# Patient Record
Sex: Male | Born: 1935 | Smoking: Current every day smoker
Health system: Southern US, Community
[De-identification: ages and names within clinical notes are randomized; demographics above are authoritative.]

## PROBLEM LIST (undated history)

## (undated) DIAGNOSIS — I2510 Atherosclerotic heart disease of native coronary artery without angina pectoris: Secondary | ICD-10-CM

## (undated) DIAGNOSIS — F419 Anxiety disorder, unspecified: Secondary | ICD-10-CM

## (undated) DIAGNOSIS — R569 Unspecified convulsions: Secondary | ICD-10-CM

## (undated) DIAGNOSIS — I739 Peripheral vascular disease, unspecified: Secondary | ICD-10-CM

## (undated) DIAGNOSIS — I219 Acute myocardial infarction, unspecified: Secondary | ICD-10-CM

## (undated) DIAGNOSIS — M109 Gout, unspecified: Secondary | ICD-10-CM

## (undated) DIAGNOSIS — F341 Dysthymic disorder: Secondary | ICD-10-CM

## (undated) DIAGNOSIS — I639 Cerebral infarction, unspecified: Secondary | ICD-10-CM

## (undated) DIAGNOSIS — I10 Essential (primary) hypertension: Secondary | ICD-10-CM

## (undated) DIAGNOSIS — J42 Unspecified chronic bronchitis: Secondary | ICD-10-CM

## (undated) DIAGNOSIS — I499 Cardiac arrhythmia, unspecified: Secondary | ICD-10-CM

## (undated) HISTORY — DX: Cardiac arrhythmia, unspecified: I49.9

## (undated) HISTORY — DX: Cerebral infarction, unspecified: I63.9

## (undated) HISTORY — DX: Anxiety disorder, unspecified: F41.9

## (undated) HISTORY — PX: APPENDECTOMY: SHX54

## (undated) HISTORY — DX: Essential (primary) hypertension: I10

## (undated) HISTORY — PX: HERNIA REPAIR: SHX51

## (undated) HISTORY — DX: Atherosclerotic heart disease of native coronary artery without angina pectoris: I25.10

## (undated) HISTORY — DX: Peripheral vascular disease, unspecified: I73.9

## (undated) HISTORY — DX: Dysthymic disorder: F34.1

## (undated) HISTORY — PX: CHOLECYSTECTOMY: SHX55

## (undated) HISTORY — PX: SKIN CANCER EXCISION: SHX779

## (undated) HISTORY — DX: Unspecified convulsions: R56.9

## (undated) HISTORY — DX: Acute myocardial infarction, unspecified: I21.9

## (undated) HISTORY — PX: CARDIAC CATHETERIZATION: SHX172

## (undated) HISTORY — DX: Unspecified chronic bronchitis: J42

## (undated) HISTORY — DX: Gout, unspecified: M10.9

## (undated) HISTORY — PX: OTHER SURGICAL HISTORY: SHX169

---

## 2010-12-31 ENCOUNTER — Ambulatory Visit: Payer: Medicare Other | Admitting: Family Medicine

## 2011-01-19 ENCOUNTER — Emergency Department: Payer: Medicare Other | Admitting: Emergency Medicine

## 2011-01-19 ENCOUNTER — Encounter: Payer: Self-pay | Admitting: Cardiology

## 2011-01-29 ENCOUNTER — Encounter: Payer: Self-pay | Admitting: Cardiology

## 2011-01-30 ENCOUNTER — Encounter: Payer: Self-pay | Admitting: *Deleted

## 2011-01-30 ENCOUNTER — Encounter: Payer: Self-pay | Admitting: Cardiology

## 2011-01-30 ENCOUNTER — Ambulatory Visit (INDEPENDENT_AMBULATORY_CARE_PROVIDER_SITE_OTHER): Payer: Medicare Other | Admitting: Cardiology

## 2011-01-30 DIAGNOSIS — R42 Dizziness and giddiness: Secondary | ICD-10-CM

## 2011-01-30 DIAGNOSIS — I712 Thoracic aortic aneurysm, without rupture, unspecified: Secondary | ICD-10-CM

## 2011-01-30 DIAGNOSIS — I447 Left bundle-branch block, unspecified: Secondary | ICD-10-CM

## 2011-01-30 DIAGNOSIS — R0609 Other forms of dyspnea: Secondary | ICD-10-CM

## 2011-01-30 DIAGNOSIS — R0602 Shortness of breath: Secondary | ICD-10-CM

## 2011-01-30 DIAGNOSIS — I2510 Atherosclerotic heart disease of native coronary artery without angina pectoris: Secondary | ICD-10-CM

## 2011-01-30 DIAGNOSIS — I10 Essential (primary) hypertension: Secondary | ICD-10-CM | POA: Insufficient documentation

## 2011-01-30 DIAGNOSIS — E785 Hyperlipidemia, unspecified: Secondary | ICD-10-CM

## 2011-01-30 DIAGNOSIS — R0600 Dyspnea, unspecified: Secondary | ICD-10-CM

## 2011-01-30 DIAGNOSIS — I77810 Thoracic aortic ectasia: Secondary | ICD-10-CM

## 2011-01-30 NOTE — Assessment & Plan Note (Signed)
Patient has LBBB of uncertain duration.  Could represent conduction system disease, but could also be a sign of CAD.  Will get [employer] (as above).

## 2011-01-30 NOTE — Patient Instructions (Signed)
Stop taking Sudafed, change to Coricidin.  Your physician has requested that you have an echocardiogram. Echocardiography is a painless test that uses sound waves to create images of your heart. It provides your doctor with information about the size and shape of your heart and how well your heart's chambers and valves are working. This procedure takes approximately one hour. There are no restrictions for this procedure.  Your physician has requested that you have a lexiscan myoview. For further information please visit [email or web address] Please follow instruction sheet, as given.  Please get a Chest X-Ray today.  We will call you with lab results drawn today.  Your physician recommends that you schedule a follow-up appointment in: 2 weeks

## 2011-01-30 NOTE — Progress Notes (Signed)
PCP: Dr. Jake Shark   75 yo with history of smoking, CVA, and diabetes presents for evaluation of dyspnea.  He was referred by the ER.  For about 1 month, he has been "sick."  He has had a chronic cough and periodic fevers.  He has been short of breath just walking around his house over that period.  He wheezes at night.  Prior to this month, he denies significant dyspnea.  No orthopnea or PND.  He has had no chest pain.  He gets periodic dizziness.  This seems to be a vertigo-like spinning sensation that will last for a few seconds then resolve.  It does not seem to be positional.  No claudication-type leg pain.   He went to the ER on 12/16 with fever to 101, dizziness and shortness of breath.  CXR at the time showed no PNA.  There was cardiomegaly with a vague LUL nodular density thought to most likely represent an osteophyte.  ECG showed LBBB in the ER.  Cardiac enzymes and influenza test were negative.  He was treated for acute bronchitis with a course of levofloxacin.  Cough persisted, and he has been started recently on a course of azithromycin.  He is taking pseudoephedrine and BP is running high.   CXR was done today, showing no PNA or edema.  There was, however, a dilated thoracic aorta up to 6.5 cm in greatest diameter . Again, the patient has had no chest pain.  I had the radiologist pull up the prior CXR from 12/16.  Though it was not reported, the aorta was similarly dilated at that time as well.   ECG: NSR, LBBB, occasional PACs  Labs (12/12): cardiac enzymes negative, K 4.2, creatinine 1.9, HCT 42.5  PMH: 1. CVA: On Plavix. 2. LBBB: ? Chronic or new 3. CKD 4. Active smoker, probable COPD 5. Gout 6. Hyperlipidemia 7. Diabetes mellitus type II 8. Depression 9. H/o cholecystectomy 10.  Head injury requiring surgery in 1960s, sounds like traumatic subdural hematoma.  11.  HTN  SH: Single, lives in [place] with daughter, moved here in 10/12 from [place].  Smokes 1 ppd.  Used  to drink heavily but quit around 6/12.   FH: Mother with CABG in her 70s.  Father with cirrhosis, CAD.   ROS: All systems reviewed and negative except as per HPI.   Current Outpatient Prescriptions  Medication Sig Dispense Refill  . allopurinol (ZYLOPRIM) 300 MG tablet Take 300 mg by mouth daily.        Marland Kitchen amitriptyline (ELAVIL) 25 MG tablet Take 25 mg by mouth at bedtime.        Marland Kitchen atorvastatin (LIPITOR) 20 MG tablet Take 20 mg by mouth daily.        Marland Kitchen azithromycin (ZITHROMAX) 250 MG tablet Take 250 mg by mouth daily.        . cetirizine-pseudoephedrine (ZYRTEC-D) 5-120 MG per tablet Take 1 tablet by mouth 1 day or 1 dose.        . cloNIDine (CATAPRES) 0.1 MG tablet Take one-half tablet by mouth twice a day.       . clopidogrel (PLAVIX) 75 MG tablet Take 75 mg by mouth daily.        Marland Kitchen glipiZIDE (GLUCOTROL XL) 2.5 MG 24 hr tablet Take 2.5 mg by mouth daily.        . Naproxen Sodium (ALEVE) 220 MG CAPS Take by mouth.        . sertraline (ZOLOFT) 100 MG tablet Take  100 mg by mouth daily.          BP 152/72  Pulse 96  Ht 5\' 7"  (1.702 m)  Wt 86.41 kg (190 lb 8 oz)  BMI 29.84 kg/m2 General: NAD Neck: No JVD, no thyromegaly or thyroid nodule.  Lungs: Clear to auscultation bilaterally with normal respiratory effort, mildly prolonged expiratory phase.  No wheezes or crackles. CV: Nondisplaced PMI.  Heart regular S1/S2, no S3/S4, no murmur.  1+ bilateral ankle edema.  No carotid bruit.  1+ PT pulse on the left, unable to palpate PT pulse on the right.  Abdomen: Soft, nontender, no hepatosplenomegaly, no distention.  Skin: Intact without lesions or rashes.  Neurologic: Alert and oriented x 3.  Psych: Normal affect. Extremities: No clubbing or cyanosis.  HEENT: Normal.

## 2011-01-30 NOTE — Assessment & Plan Note (Signed)
Patient has had dypsnea with exertion, chronic cough, and occasional fever over the last month.  On exam today, he does not appear volume overloaded.  CXR showed no PNA or edema.  Given his smoking history, this certainly could represent acute bronchitis with COPD flare and prolonged cough.  His lungs are clear and he is not wheezing, so I do not think that addition of steroids is warranted at this time.   - Complete azithromycin course . - Will check echocardiogram for LV and RV function and will also get a BNP.  - Exertional dyspnea could certainly be an anginal equivalent.  He has a LBBB of uncertain age.  I will also arrange for a Lexiscan myoview.

## 2011-01-30 NOTE — Assessment & Plan Note (Signed)
Tortuous thoracic aorta, appears to be dilated to 6.5 cm on the CXR today.  I had the radiologist review the study from 12/16.  Though not mentioned in the report, the same finding was present then.  The patient denies any chest pain, so less likely acute dissection.  I think this needs to be imaged more closely.  I will check a BMET today.  If creatinine is </= 1.5, will get CT angiogram of the chest.  If > 1.5, will get CT chest without contrast.

## 2011-01-30 NOTE — Assessment & Plan Note (Signed)
He is on clonidine only.  Not ideal regimen, will change eventually when he is over the acute illness.  I asked him to stop taking pseudoephedrine and to use Coricidin instead for congestion.

## 2011-01-30 NOTE — Progress Notes (Signed)
Addended by: Lanny Hurst E on: 01/30/2011 05:02 PM   Modules accepted: Orders

## 2011-01-30 NOTE — Assessment & Plan Note (Signed)
Known vascular disease with history of CVA.  Goal LDL < 70.  Will check lipids/LFTs today.

## 2011-01-31 ENCOUNTER — Encounter: Payer: Self-pay | Admitting: Cardiology

## 2011-01-31 ENCOUNTER — Encounter: Payer: Self-pay | Admitting: Cardiovascular Disease

## 2011-01-31 ENCOUNTER — Telehealth: Payer: Self-pay | Admitting: *Deleted

## 2011-01-31 ENCOUNTER — Other Ambulatory Visit: Payer: Self-pay | Admitting: Cardiology

## 2011-01-31 DIAGNOSIS — I712 Thoracic aortic aneurysm, without rupture: Secondary | ICD-10-CM

## 2011-01-31 DIAGNOSIS — R7989 Other specified abnormal findings of blood chemistry: Secondary | ICD-10-CM

## 2011-01-31 LAB — LIPID PANEL
Chol/HDL Ratio: 3.8 ratio units (ref 0.0–5.0)
Cholesterol, Total: 114 mg/dL (ref 100–199)
LDL Calculated: 49 mg/dL (ref 0–99)
VLDL Cholesterol Cal: 35 mg/dL (ref 5–40)

## 2011-01-31 LAB — BASIC METABOLIC PANEL
BUN/Creatinine Ratio: 12 (ref 10–22)
BUN: 21 mg/dL (ref 8–27)
CO2: 22 mmol/L (ref 20–32)
Calcium: 8.5 mg/dL — ABNORMAL LOW (ref 8.6–10.2)
Chloride: 102 mmol/L (ref 97–108)

## 2011-01-31 LAB — HEPATIC FUNCTION PANEL
Albumin: 3.9 g/dL (ref 3.5–4.8)
Alkaline Phosphatase: 79 IU/L (ref 25–160)
Bilirubin, Direct: 0.1 mg/dL (ref 0.00–0.40)
Total Bilirubin: 0.2 mg/dL (ref 0.0–1.2)
Total Protein: 6.7 g/dL (ref 6.0–8.5)

## 2011-01-31 LAB — BRAIN NATRIURETIC PEPTIDE: BNP: 477.1 pg/mL — ABNORMAL HIGH (ref 0.0–100.0)

## 2011-01-31 NOTE — Telephone Encounter (Signed)
Pt scheduled for CT scan chest without contrast. Radiologist at [facility] advised we use contrast for reason of test; stated their cut off of creat is 1.8. Pt's creat is 1.8. Per Dr. Shirlee Latch, ok to change order to with contrast as long as pt pushes fluids all day. Pt does not take lasix, no h/o HF.  Pt and daughter aware to push fluids to protect kidneys. New order sent.

## 2011-01-31 NOTE — Telephone Encounter (Signed)
Notified pts daughter of results of CT-Angiogram, pt does have aortic aneurysm, at this point per Dr. Shirlee Latch, will need to keep close watch on his BP. No surgery/intervention needed at this time. He does not have dissection. Pt instructed to drink plenty of fluids today and throughout weekend. He will come in Monday for BMP. Pt also advised to monitor BP and bring numbers with him on Monday and will show Dr. Shirlee Latch for BP mgt.

## 2011-02-01 ENCOUNTER — Inpatient Hospital Stay: Payer: Medicare Other | Admitting: Internal Medicine

## 2011-02-03 ENCOUNTER — Encounter: Payer: Self-pay | Admitting: Cardiovascular Disease

## 2011-02-03 ENCOUNTER — Other Ambulatory Visit: Payer: Medicare Other | Admitting: *Deleted

## 2011-02-05 ENCOUNTER — Other Ambulatory Visit (HOSPITAL_COMMUNITY): Payer: Medicare Other | Admitting: Radiology

## 2011-02-11 ENCOUNTER — Ambulatory Visit: Payer: Medicare Other | Admitting: Cardiovascular Disease

## 2011-02-11 ENCOUNTER — Encounter: Payer: Self-pay | Admitting: Cardiovascular Disease

## 2011-02-11 ENCOUNTER — Ambulatory Visit (INDEPENDENT_AMBULATORY_CARE_PROVIDER_SITE_OTHER): Payer: Medicare Other | Admitting: Cardiovascular Disease

## 2011-02-11 VITALS — BP 140/85 | HR 75 | Resp 16 | Ht 67.0 in | Wt 187.0 lb

## 2011-02-11 DIAGNOSIS — R9431 Abnormal electrocardiogram [ECG] [EKG]: Secondary | ICD-10-CM

## 2011-02-11 DIAGNOSIS — I712 Thoracic aortic aneurysm, without rupture, unspecified: Secondary | ICD-10-CM

## 2011-02-11 DIAGNOSIS — R0600 Dyspnea, unspecified: Secondary | ICD-10-CM

## 2011-02-11 DIAGNOSIS — I714 Abdominal aortic aneurysm, without rupture, unspecified: Secondary | ICD-10-CM

## 2011-02-11 DIAGNOSIS — R0602 Shortness of breath: Secondary | ICD-10-CM

## 2011-02-11 DIAGNOSIS — R0989 Other specified symptoms and signs involving the circulatory and respiratory systems: Secondary | ICD-10-CM

## 2011-02-11 DIAGNOSIS — E785 Hyperlipidemia, unspecified: Secondary | ICD-10-CM

## 2011-02-11 DIAGNOSIS — I10 Essential (primary) hypertension: Secondary | ICD-10-CM

## 2011-02-11 DIAGNOSIS — R5383 Other fatigue: Secondary | ICD-10-CM | POA: Insufficient documentation

## 2011-02-11 DIAGNOSIS — R5381 Other malaise: Secondary | ICD-10-CM

## 2011-02-11 NOTE — Assessment & Plan Note (Addendum)
We have suggested he have a repeat abdominal aortic ultrasound in one year to evaluate his 4.8 cm AAA. Medical management for his 4 cm thoracic aneurysm.

## 2011-02-11 NOTE — Assessment & Plan Note (Signed)
Etiology of his fatigue is uncertain. He is high risk for underlying coronary artery disease. Has carotid disease on exam, known aortic disease and lower extremity disease. He has not had a stress test in many years. We have ordered a pharmacologic Myoview to rule out ischemia as a cause of his fatigue and EKG changes as well as shortness of breath.

## 2011-02-11 NOTE — Progress Notes (Signed)
Patient ID: Stephen Weeks, male    DOB: 22-Apr-1935, 76 y.o.   MRN: [id number]  HPI Comments: 76 yo with history of smoking (still smoking), COPD, CVA, and diabetes presents for routine follow up. Notes indicate a history of coronary artery disease and prior stenting though the details are unavailable. He does report stenting in his legs. He was seen on December 27 and shortly after that was admitted to the hospital with recurrent bronchitis. Earlier that month, he had a long hospital course for pneumonia. He was found to have 4 cm descending thoracic aneurysm, 4.8 cm infrarenal AAA, also with dehydration and renal failure, severe hypertension on arrival to the emergency room with shortness of breath.  Currently he reports feeling much better. He believes his bronchitis has resolved and his blood pressure is significantly better on his current medication regimen. He is concerned about the CT scan. He continues to smoke one pack per day. He has had worsening memory and does have chronic fatigue over the past several years.   ECG: Left bundle branch block with rate 58 beats per minute  February 02, 2011 total cholesterol 118, LDL 64, HDL chief Echocardiogram February 01, 2011 shows ejection fraction 50%, mitral valve regurgitation    Outpatient Encounter Prescriptions as of 02/11/2011  Medication Sig Dispense Refill  . allopurinol (ZYLOPRIM) 300 MG tablet Take 300 mg by mouth daily.        Marland Kitchen amitriptyline (ELAVIL) 25 MG tablet Take 25 mg by mouth at bedtime.        Marland Kitchen aspirin 81 MG tablet Take 81 mg by mouth daily.        Marland Kitchen atorvastatin (LIPITOR) 20 MG tablet Take 20 mg by mouth daily.        Marland Kitchen azithromycin (ZITHROMAX) 250 MG tablet Take 250 mg by mouth daily.        . cetirizine-pseudoephedrine (ZYRTEC-D) 5-120 MG per tablet Take 1 tablet by mouth 1 day or 1 dose.        . cloNIDine (CATAPRES) 0.1 MG tablet Take one-half tablet by mouth twice a day.       . clopidogrel (PLAVIX) 75 MG tablet  Take 75 mg by mouth daily.        Marland Kitchen glipiZIDE (GLUCOTROL XL) 2.5 MG 24 hr tablet Take 2.5 mg by mouth daily.        . metoprolol (LOPRESSOR) 50 MG tablet Take 50 mg by mouth 2 (two) times daily.        . Naproxen Sodium (ALEVE) 220 MG CAPS Take by mouth.        . sertraline (ZOLOFT) 100 MG tablet Take 100 mg by mouth daily.           Review of Systems  Constitutional: Positive for fatigue.  HENT: Negative.   Eyes: Negative.   Respiratory: Positive for shortness of breath.   Cardiovascular: Negative.   Gastrointestinal: Negative.   Musculoskeletal: Negative.   Skin: Negative.   Neurological: Negative.   Hematological: Negative.   Psychiatric/Behavioral: Negative.   All other systems reviewed and are negative.    BP 140/85  Pulse 75  Resp 16  Ht 5\' 7"  (1.702 m)  Wt 187 lb (84.823 kg)  BMI 29.29 kg/m2  Physical Exam  Nursing note and vitals reviewed. Constitutional: He is oriented to person, place, and time. He appears well-developed and well-nourished.  HENT:  Head: Normocephalic.  Nose: Nose normal.  Mouth/Throat: Oropharynx is clear and moist.  Eyes: Conjunctivae are normal. Pupils  are equal, round, and reactive to light.  Neck: Normal range of motion. Neck supple. No JVD present. Carotid bruit is present.  Cardiovascular: Normal rate, regular rhythm, S1 normal, S2 normal and intact distal pulses.  Exam reveals no gallop and no friction rub.   Murmur heard.  Crescendo systolic murmur is present with a grade of 1/6  Pulmonary/Chest: Effort normal. No respiratory distress. He has decreased breath sounds. He has no wheezes. He has no rales. He exhibits no tenderness.  Abdominal: Soft. Bowel sounds are normal. He exhibits no distension. There is no tenderness.  Musculoskeletal: Normal range of motion. He exhibits no edema and no tenderness.  Lymphadenopathy:    He has no cervical adenopathy.  Neurological: He is alert and oriented to person, place, and time. Coordination  normal.  Skin: Skin is warm and dry. No rash noted. No erythema.  Psychiatric: He has a normal mood and affect. His behavior is normal. Judgment and thought content normal.           Assessment and Plan

## 2011-02-11 NOTE — Assessment & Plan Note (Addendum)
I suspect his shortness of breath could be multifactorial though concerning for coronary artery disease, COPD as well as deconditioning. He is recovering from bronchitis. We'll schedule him for a stress test at [facility].

## 2011-02-11 NOTE — Patient Instructions (Addendum)
You are doing well. No medication changes were made.  We will call you to schedule a carotid artery ultasound repeat abdomenal ultrasound    We will call you to schedule a stress test for fatigue  Please call us if you have new issues that need to be addressed before your next appt.  Your physician wants you to follow-up in: 6 months.  You will receive a reminder letter in the mail two months in advance. If you don't receive a letter, please call our office to schedule the follow-up appointment.

## 2011-02-11 NOTE — Assessment & Plan Note (Signed)
Carotid bruit on the left. Carotid ultrasound will be ordered.

## 2011-02-11 NOTE — Assessment & Plan Note (Signed)
Cholesterol is at goal on the current lipid regimen. No changes to the medications were made.  

## 2011-02-11 NOTE — Assessment & Plan Note (Signed)
Blood pressures well controlled on his current medication regimen. No changes made

## 2011-02-12 ENCOUNTER — Telehealth: Payer: Self-pay | Admitting: *Deleted

## 2011-02-12 NOTE — Telephone Encounter (Signed)
Attempted to contact pt's daughter, Lupita Leash, [place] TCB. Pt has been r/s for lexiscan per TG. He will need to arrive at 0730 for 0800 start for procedure. Will give instructions for meds when she calls back.

## 2011-02-12 NOTE — Telephone Encounter (Signed)
Pt's daughter notified of instructions and date/time.

## 2011-02-17 ENCOUNTER — Telehealth: Payer: Self-pay | Admitting: *Deleted

## 2011-02-17 NOTE — Telephone Encounter (Signed)
Pt's son-in-law notified of the below. Also notified of change in time for lexiscan tomorrow, asked that he arrive at 7am at Registration at [facility].

## 2011-02-17 NOTE — Telephone Encounter (Signed)
Attempted to contact pt's daughter, LMOM TCB. Per Dr. Mariah Milling, pt's echo that was done at [facility] actually shows mitral regurgitatio moderate, instead of moderate to severe per report. Will notify of this result and cancel upcoming echo on 1/29 that was previously scheduled.

## 2011-02-18 ENCOUNTER — Encounter: Payer: Medicare Other | Admitting: *Deleted

## 2011-02-18 ENCOUNTER — Ambulatory Visit: Payer: Self-pay | Admitting: Cardiovascular Disease

## 2011-02-18 ENCOUNTER — Encounter: Payer: Self-pay | Admitting: Cardiovascular Disease

## 2011-02-18 DIAGNOSIS — R0602 Shortness of breath: Secondary | ICD-10-CM

## 2011-02-19 ENCOUNTER — Telehealth: Payer: Self-pay | Admitting: *Deleted

## 2011-02-19 NOTE — Telephone Encounter (Signed)
He will need a carotid ultrasound if he has not had one

## 2011-02-19 NOTE — Telephone Encounter (Signed)
Pt had myoview 02/18/11 at [facility], and he was also scheduled for carotid here at office. We cancelled the carotid, I believe was old order prior to his admission to [facility] recently. But wanted to find out if pt still needs carotid r/s? Please advised.

## 2011-02-20 NOTE — Telephone Encounter (Signed)
Called patient and made appt for the carotid on feb. 1st at 11:30am.

## 2011-02-21 ENCOUNTER — Other Ambulatory Visit: Payer: Medicare Other | Admitting: *Deleted

## 2011-02-21 NOTE — Progress Notes (Signed)
Forward to MD to review.

## 2011-02-25 ENCOUNTER — Encounter: Payer: Self-pay | Admitting: Cardiovascular Disease

## 2011-02-26 ENCOUNTER — Telehealth: Payer: Self-pay | Admitting: *Deleted

## 2011-02-26 NOTE — Telephone Encounter (Signed)
Notified pt's daughter, Lupita Leash, that per Dr. Mariah Milling, Eugenie Birks did show sign of old damage and cardiac squeeze was decr. Advised if he continues to have CP to be seen in clinic as TG may want to schedule cardiac cath, otherwise, if no s/s to be seen in 2-3 months. Daughter states he is in the middle of nose surgery right now and she will call back tomorrow to schedule appt.

## 2011-03-04 ENCOUNTER — Other Ambulatory Visit: Payer: Medicare Other | Admitting: *Deleted

## 2011-03-06 ENCOUNTER — Encounter: Payer: Self-pay | Admitting: Cardiovascular Disease

## 2011-03-07 ENCOUNTER — Encounter (INDEPENDENT_AMBULATORY_CARE_PROVIDER_SITE_OTHER): Payer: Medicare Other | Admitting: *Deleted

## 2011-03-07 DIAGNOSIS — R0989 Other specified symptoms and signs involving the circulatory and respiratory systems: Secondary | ICD-10-CM

## 2011-03-07 DIAGNOSIS — I6529 Occlusion and stenosis of unspecified carotid artery: Secondary | ICD-10-CM

## 2011-05-26 ENCOUNTER — Ambulatory Visit: Payer: Self-pay

## 2011-05-26 LAB — COMPREHENSIVE METABOLIC PANEL
Albumin: 3.6 g/dL (ref 3.4–5.0)
BUN: 22 mg/dL — ABNORMAL HIGH (ref 7–18)
Calcium, Total: 8.5 mg/dL (ref 8.5–10.1)
Chloride: 108 mmol/L — ABNORMAL HIGH (ref 98–107)
Co2: 25 mmol/L (ref 21–32)
Creatinine: 1.68 mg/dL — ABNORMAL HIGH (ref 0.60–1.30)
EGFR (African American): 46 — ABNORMAL LOW
Potassium: 4.7 mmol/L (ref 3.5–5.1)
Sodium: 142 mmol/L (ref 136–145)
Total Protein: 7.2 g/dL (ref 6.4–8.2)

## 2011-07-23 ENCOUNTER — Emergency Department: Payer: Self-pay

## 2011-07-23 LAB — CBC
HCT: 46.3 % (ref 40.0–52.0)
MCH: 30.4 pg (ref 26.0–34.0)
MCHC: 33.4 g/dL (ref 32.0–36.0)
MCV: 91 fL (ref 80–100)
Platelet: 159 10*3/uL (ref 150–440)
RDW: 15.5 % — ABNORMAL HIGH (ref 11.5–14.5)
WBC: 11.5 10*3/uL — ABNORMAL HIGH (ref 3.8–10.6)

## 2011-07-23 LAB — ETHANOL: Ethanol: <3 mg/dL

## 2011-07-23 LAB — BASIC METABOLIC PANEL
Anion Gap: 8 (ref 7–16)
BUN: 22 mg/dL — ABNORMAL HIGH (ref 7–18)
Calcium, Total: 8.9 mg/dL (ref 8.5–10.1)
Chloride: 104 mmol/L (ref 98–107)
EGFR (Non-African Amer.): 39 — ABNORMAL LOW
Osmolality: 282 (ref 275–301)
Sodium: 139 mmol/L (ref 136–145)

## 2011-07-23 LAB — TSH: Thyroid Stimulating Horm: 2.07 u[IU]/mL

## 2011-07-23 LAB — URINALYSIS, COMPLETE
Bilirubin,UR: NEGATIVE
Blood: NEGATIVE
Glucose,UR: NEGATIVE mg/dL (ref 0–75)
Hyaline Cast: 9
Leukocyte Esterase: NEGATIVE
Nitrite: NEGATIVE
Protein: 100
WBC UR: 2 /HPF (ref 0–5)

## 2011-07-23 LAB — DRUG SCREEN, URINE
Cannabinoid 50 Ng, Ur ~~LOC~~: NEGATIVE (ref ?–50)
MDMA (Ecstasy)Ur Screen: NEGATIVE (ref ?–500)
Opiate, Ur Screen: NEGATIVE (ref ?–300)
Phencyclidine (PCP) Ur S: NEGATIVE (ref ?–25)

## 2011-07-23 LAB — CK TOTAL AND CKMB (NOT AT ARMC): CK, Total: 76 U/L (ref 35–232)

## 2011-08-18 LAB — URINALYSIS, COMPLETE
Bacteria: NONE SEEN
Bilirubin,UR: NEGATIVE
Glucose,UR: NEGATIVE mg/dL (ref 0–75)
Ph: 5 (ref 4.5–8.0)
RBC,UR: 1 /HPF (ref 0–5)
Squamous Epithelial: NONE SEEN

## 2011-08-18 LAB — CBC
MCH: 31 pg (ref 26.0–34.0)
MCHC: 33.8 g/dL (ref 32.0–36.0)
Platelet: 140 10*3/uL — ABNORMAL LOW (ref 150–440)
RBC: 4.57 10*6/uL (ref 4.40–5.90)

## 2011-08-18 LAB — BASIC METABOLIC PANEL
BUN: 20 mg/dL — ABNORMAL HIGH (ref 7–18)
Calcium, Total: 8.4 mg/dL — ABNORMAL LOW (ref 8.5–10.1)
EGFR (Non-African Amer.): 41 — ABNORMAL LOW
Glucose: 131 mg/dL — ABNORMAL HIGH (ref 65–99)
Potassium: 4.1 mmol/L (ref 3.5–5.1)
Sodium: 142 mmol/L (ref 136–145)

## 2011-08-18 LAB — CK TOTAL AND CKMB (NOT AT ARMC)
CK, Total: 47 U/L (ref 35–232)
CK-MB: 1.2 ng/mL (ref 0.5–3.6)

## 2011-08-18 LAB — TROPONIN I: Troponin-I: 0.02 ng/mL

## 2011-08-19 LAB — CK TOTAL AND CKMB (NOT AT ARMC)
CK, Total: 36 U/L (ref 35–232)
CK-MB: 1 ng/mL (ref 0.5–3.6)
CK-MB: 1.1 ng/mL (ref 0.5–3.6)

## 2011-08-19 LAB — BASIC METABOLIC PANEL
Anion Gap: 8 (ref 7–16)
Chloride: 108 mmol/L — ABNORMAL HIGH (ref 98–107)
Co2: 27 mmol/L (ref 21–32)
Creatinine: 1.71 mg/dL — ABNORMAL HIGH (ref 0.60–1.30)
Sodium: 143 mmol/L (ref 136–145)

## 2011-08-19 LAB — URINE CULTURE

## 2011-08-20 ENCOUNTER — Inpatient Hospital Stay: Payer: Self-pay | Admitting: Internal Medicine

## 2011-08-20 LAB — BASIC METABOLIC PANEL
Anion Gap: 6 — ABNORMAL LOW (ref 7–16)
Calcium, Total: 8.5 mg/dL (ref 8.5–10.1)
Chloride: 109 mmol/L — ABNORMAL HIGH (ref 98–107)
EGFR (Non-African Amer.): 37 — ABNORMAL LOW
Glucose: 122 mg/dL — ABNORMAL HIGH (ref 65–99)
Potassium: 4.6 mmol/L (ref 3.5–5.1)
Sodium: 143 mmol/L (ref 136–145)

## 2011-08-21 LAB — BASIC METABOLIC PANEL
Anion Gap: 5 — ABNORMAL LOW (ref 7–16)
BUN: 24 mg/dL — ABNORMAL HIGH (ref 7–18)
Chloride: 107 mmol/L (ref 98–107)
Creatinine: 1.97 mg/dL — ABNORMAL HIGH (ref 0.60–1.30)
EGFR (African American): 38 — ABNORMAL LOW
EGFR (Non-African Amer.): 33 — ABNORMAL LOW
Osmolality: 287 (ref 275–301)

## 2011-08-21 LAB — CBC WITH DIFFERENTIAL/PLATELET
Basophil #: 0 10*3/uL (ref 0.0–0.1)
Lymphocyte %: 8.2 %
MCH: 29.9 pg (ref 26.0–34.0)
MCHC: 32.4 g/dL (ref 32.0–36.0)
MCV: 92 fL (ref 80–100)
Monocyte %: 4.2 %
Neutrophil #: 12.8 10*3/uL — ABNORMAL HIGH (ref 1.4–6.5)
Platelet: 132 10*3/uL — ABNORMAL LOW (ref 150–440)
RDW: 15.4 % — ABNORMAL HIGH (ref 11.5–14.5)

## 2011-08-22 ENCOUNTER — Emergency Department: Payer: Self-pay | Admitting: Emergency Medicine

## 2011-08-22 LAB — COMPREHENSIVE METABOLIC PANEL
Albumin: 3.4 g/dL (ref 3.4–5.0)
Alkaline Phosphatase: 70 U/L (ref 50–136)
Anion Gap: 12 (ref 7–16)
Bilirubin,Total: 0.3 mg/dL (ref 0.2–1.0)
Calcium, Total: 8.7 mg/dL (ref 8.5–10.1)
EGFR (Non-African Amer.): 38 — ABNORMAL LOW
Glucose: 82 mg/dL (ref 65–99)
Osmolality: 292 (ref 275–301)
Potassium: 3.8 mmol/L (ref 3.5–5.1)
SGPT (ALT): 14 U/L
Sodium: 145 mmol/L (ref 136–145)
Total Protein: 7 g/dL (ref 6.4–8.2)

## 2011-08-22 LAB — CBC WITH DIFFERENTIAL/PLATELET
Basophil #: 0 10*3/uL (ref 0.0–0.1)
Basophil %: 0.1 %
Eosinophil #: 0 10*3/uL (ref 0.0–0.7)
HCT: 42.2 % (ref 40.0–52.0)
HGB: 13.9 g/dL (ref 13.0–18.0)
Lymphocyte %: 14.7 %
MCHC: 33 g/dL (ref 32.0–36.0)
Monocyte #: 0.6 x10 3/mm (ref 0.2–1.0)
Monocyte %: 4.8 %
Neutrophil %: 80.3 %
RBC: 4.57 10*6/uL (ref 4.40–5.90)
RDW: 15.4 % — ABNORMAL HIGH (ref 11.5–14.5)
WBC: 13.4 10*3/uL — ABNORMAL HIGH (ref 3.8–10.6)

## 2011-08-22 LAB — ETHANOL
Ethanol %: 0.011 % (ref 0.000–0.080)
Ethanol: 11 mg/dL

## 2011-08-22 LAB — URINALYSIS, COMPLETE
Bilirubin,UR: NEGATIVE
Glucose,UR: NEGATIVE mg/dL (ref 0–75)
Ketone: NEGATIVE
RBC,UR: 1 /HPF (ref 0–5)
Squamous Epithelial: NONE SEEN
WBC UR: 1 /HPF (ref 0–5)

## 2011-08-24 LAB — CULTURE, BLOOD (SINGLE)

## 2011-08-26 ENCOUNTER — Inpatient Hospital Stay: Payer: Self-pay | Admitting: Internal Medicine

## 2011-08-26 LAB — CBC WITH DIFFERENTIAL/PLATELET
Basophil %: 0.4 %
Eosinophil %: 0 %
HCT: 41.1 % (ref 40.0–52.0)
HGB: 13.8 g/dL (ref 13.0–18.0)
Lymphocyte #: 2.4 10*3/uL (ref 1.0–3.6)
MCH: 30.7 pg (ref 26.0–34.0)
MCHC: 33.5 g/dL (ref 32.0–36.0)
MCV: 92 fL (ref 80–100)
Monocyte #: 0.8 x10 3/mm (ref 0.2–1.0)
Monocyte %: 7 %
Neutrophil #: 7.9 10*3/uL — ABNORMAL HIGH (ref 1.4–6.5)
RBC: 4.49 10*6/uL (ref 4.40–5.90)
WBC: 11.1 10*3/uL — ABNORMAL HIGH (ref 3.8–10.6)

## 2011-08-26 LAB — COMPREHENSIVE METABOLIC PANEL
Albumin: 3.1 g/dL — ABNORMAL LOW (ref 3.4–5.0)
Anion Gap: 9 (ref 7–16)
BUN: 29 mg/dL — ABNORMAL HIGH (ref 7–18)
Bilirubin,Total: 0.5 mg/dL (ref 0.2–1.0)
Calcium, Total: 8 mg/dL — ABNORMAL LOW (ref 8.5–10.1)
Chloride: 109 mmol/L — ABNORMAL HIGH (ref 98–107)
Co2: 24 mmol/L (ref 21–32)
Creatinine: 1.82 mg/dL — ABNORMAL HIGH (ref 0.60–1.30)
EGFR (African American): 42 — ABNORMAL LOW
EGFR (Non-African Amer.): 36 — ABNORMAL LOW
Osmolality: 288 (ref 275–301)
Potassium: 3.9 mmol/L (ref 3.5–5.1)
SGOT(AST): 19 U/L (ref 15–37)
Sodium: 142 mmol/L (ref 136–145)
Total Protein: 6.4 g/dL (ref 6.4–8.2)

## 2011-08-26 LAB — TROPONIN I
Troponin-I: 0.07 ng/mL — ABNORMAL HIGH
Troponin-I: 0.05 ng/mL

## 2011-08-26 LAB — CK TOTAL AND CKMB (NOT AT ARMC): CK-MB: 1.8 ng/mL (ref 0.5–3.6)

## 2011-08-26 LAB — PROTIME-INR
INR: 1
Prothrombin Time: 13.3 secs (ref 11.5–14.7)

## 2011-08-27 LAB — BASIC METABOLIC PANEL
Anion Gap: 9 (ref 7–16)
Calcium, Total: 8.5 mg/dL (ref 8.5–10.1)
Chloride: 103 mmol/L (ref 98–107)
Co2: 28 mmol/L (ref 21–32)
Creatinine: 2.02 mg/dL — ABNORMAL HIGH (ref 0.60–1.30)
Glucose: 168 mg/dL — ABNORMAL HIGH (ref 65–99)
Osmolality: 292 (ref 275–301)

## 2011-08-27 LAB — LIPID PANEL
Cholesterol: 151 mg/dL (ref 0–200)
HDL Cholesterol: 42 mg/dL (ref 40–60)
Ldl Cholesterol, Calc: 88 mg/dL (ref 0–100)
VLDL Cholesterol, Calc: 21 mg/dL (ref 5–40)

## 2011-08-27 LAB — CBC WITH DIFFERENTIAL/PLATELET
Basophil %: 0.4 %
Eosinophil %: 0 %
HGB: 14.3 g/dL (ref 13.0–18.0)
Lymphocyte #: 0.5 10*3/uL — ABNORMAL LOW (ref 1.0–3.6)
Lymphocyte %: 5 %
MCH: 29.6 pg (ref 26.0–34.0)
Monocyte #: 0.1 x10 3/mm — ABNORMAL LOW (ref 0.2–1.0)
Monocyte %: 1.3 %
Neutrophil #: 9.9 10*3/uL — ABNORMAL HIGH (ref 1.4–6.5)
Neutrophil %: 93.3 %
Platelet: 118 10*3/uL — ABNORMAL LOW (ref 150–440)
RBC: 4.83 10*6/uL (ref 4.40–5.90)
WBC: 10.7 10*3/uL — ABNORMAL HIGH (ref 3.8–10.6)

## 2011-08-27 LAB — CK TOTAL AND CKMB (NOT AT ARMC)
CK, Total: 41 U/L (ref 35–232)
CK-MB: 1.6 ng/mL (ref 0.5–3.6)

## 2011-08-28 LAB — CBC WITH DIFFERENTIAL/PLATELET
Basophil #: 0 10*3/uL (ref 0.0–0.1)
Basophil %: 0.1 %
Eosinophil %: 0 %
HCT: 40.6 % (ref 40.0–52.0)
HGB: 13.7 g/dL (ref 13.0–18.0)
Lymphocyte #: 0.6 10*3/uL — ABNORMAL LOW (ref 1.0–3.6)
MCHC: 33.7 g/dL (ref 32.0–36.0)
MCV: 91 fL (ref 80–100)
Monocyte #: 0.3 x10 3/mm (ref 0.2–1.0)
Monocyte %: 1.9 %
Neutrophil #: 17.3 10*3/uL — ABNORMAL HIGH (ref 1.4–6.5)
RDW: 15.1 % — ABNORMAL HIGH (ref 11.5–14.5)
WBC: 18.3 10*3/uL — ABNORMAL HIGH (ref 3.8–10.6)

## 2011-08-28 LAB — BASIC METABOLIC PANEL
Anion Gap: 11 (ref 7–16)
Calcium, Total: 8.5 mg/dL (ref 8.5–10.1)
Co2: 26 mmol/L (ref 21–32)
EGFR (African American): 38 — ABNORMAL LOW
EGFR (Non-African Amer.): 33 — ABNORMAL LOW
Glucose: 167 mg/dL — ABNORMAL HIGH (ref 65–99)
Osmolality: 294 (ref 275–301)
Sodium: 139 mmol/L (ref 136–145)

## 2011-08-29 LAB — CBC WITH DIFFERENTIAL/PLATELET
Basophil #: 0 10*3/uL (ref 0.0–0.1)
Eosinophil %: 0 %
HCT: 42 % (ref 40.0–52.0)
Lymphocyte #: 0.5 10*3/uL — ABNORMAL LOW (ref 1.0–3.6)
Lymphocyte %: 3.2 %
MCHC: 33.8 g/dL (ref 32.0–36.0)
MCV: 91 fL (ref 80–100)
Monocyte %: 1.5 %
Neutrophil #: 15.2 10*3/uL — ABNORMAL HIGH (ref 1.4–6.5)
Neutrophil %: 95.3 %
RDW: 16 % — ABNORMAL HIGH (ref 11.5–14.5)
WBC: 16 10*3/uL — ABNORMAL HIGH (ref 3.8–10.6)

## 2011-08-29 LAB — BASIC METABOLIC PANEL
Anion Gap: 8 (ref 7–16)
Calcium, Total: 8.7 mg/dL (ref 8.5–10.1)
Creatinine: 1.77 mg/dL — ABNORMAL HIGH (ref 0.60–1.30)
EGFR (Non-African Amer.): 37 — ABNORMAL LOW
Potassium: 4.8 mmol/L (ref 3.5–5.1)
Sodium: 140 mmol/L (ref 136–145)

## 2011-08-30 LAB — CBC WITH DIFFERENTIAL/PLATELET
Basophil #: 0 10*3/uL (ref 0.0–0.1)
Basophil %: 0 %
Eosinophil #: 0 10*3/uL (ref 0.0–0.7)
HCT: 41.8 % (ref 40.0–52.0)
HGB: 14.1 g/dL (ref 13.0–18.0)
Lymphocyte #: 0.6 10*3/uL — ABNORMAL LOW (ref 1.0–3.6)
MCH: 30.8 pg (ref 26.0–34.0)
MCV: 91 fL (ref 80–100)
Monocyte #: 0.5 x10 3/mm (ref 0.2–1.0)
Neutrophil #: 13.1 10*3/uL — ABNORMAL HIGH (ref 1.4–6.5)
RBC: 4.59 10*6/uL (ref 4.40–5.90)
WBC: 14.2 10*3/uL — ABNORMAL HIGH (ref 3.8–10.6)

## 2011-08-30 LAB — BASIC METABOLIC PANEL
Anion Gap: 11 (ref 7–16)
BUN: 47 mg/dL — ABNORMAL HIGH (ref 7–18)
Calcium, Total: 8.7 mg/dL (ref 8.5–10.1)
Co2: 27 mmol/L (ref 21–32)
Creatinine: 1.73 mg/dL — ABNORMAL HIGH (ref 0.60–1.30)
EGFR (African American): 44 — ABNORMAL LOW
Osmolality: 302 (ref 275–301)
Potassium: 4.7 mmol/L (ref 3.5–5.1)

## 2011-08-30 LAB — PRO B NATRIURETIC PEPTIDE: B-Type Natriuretic Peptide: 2576 pg/mL — ABNORMAL HIGH (ref 0–125)

## 2011-08-31 LAB — BASIC METABOLIC PANEL
Anion Gap: 10 (ref 7–16)
BUN: 53 mg/dL — ABNORMAL HIGH (ref 7–18)
Chloride: 103 mmol/L (ref 98–107)
Co2: 29 mmol/L (ref 21–32)
Creatinine: 1.68 mg/dL — ABNORMAL HIGH (ref 0.60–1.30)
EGFR (Non-African Amer.): 40 — ABNORMAL LOW
Glucose: 118 mg/dL — ABNORMAL HIGH (ref 65–99)
Potassium: 4.1 mmol/L (ref 3.5–5.1)
Sodium: 142 mmol/L (ref 136–145)

## 2011-09-01 LAB — BASIC METABOLIC PANEL
Anion Gap: 10 (ref 7–16)
BUN: 59 mg/dL — ABNORMAL HIGH (ref 7–18)
Chloride: 99 mmol/L (ref 98–107)
Creatinine: 1.81 mg/dL — ABNORMAL HIGH (ref 0.60–1.30)
EGFR (Non-African Amer.): 36 — ABNORMAL LOW
Glucose: 108 mg/dL — ABNORMAL HIGH (ref 65–99)
Osmolality: 296 (ref 275–301)
Potassium: 4.1 mmol/L (ref 3.5–5.1)

## 2011-09-02 LAB — CBC WITH DIFFERENTIAL/PLATELET
Basophil #: 0 10*3/uL (ref 0.0–0.1)
Eosinophil %: 0.2 %
HCT: 40.8 % (ref 40.0–52.0)
HGB: 14.1 g/dL (ref 13.0–18.0)
Lymphocyte #: 1.9 10*3/uL (ref 1.0–3.6)
Lymphocyte %: 13.8 %
MCH: 32.7 pg (ref 26.0–34.0)
MCHC: 34.7 g/dL (ref 32.0–36.0)
MCV: 94 fL (ref 80–100)
Monocyte #: 0.7 x10 3/mm (ref 0.2–1.0)
Monocyte %: 4.9 %
Neutrophil #: 11.3 10*3/uL — ABNORMAL HIGH (ref 1.4–6.5)
Platelet: 119 10*3/uL — ABNORMAL LOW (ref 150–440)
RBC: 4.32 10*6/uL — ABNORMAL LOW (ref 4.40–5.90)
RDW: 15.5 % — ABNORMAL HIGH (ref 11.5–14.5)
WBC: 13.9 10*3/uL — ABNORMAL HIGH (ref 3.8–10.6)

## 2011-09-02 LAB — BASIC METABOLIC PANEL
Anion Gap: 10 (ref 7–16)
BUN: 66 mg/dL — ABNORMAL HIGH (ref 7–18)
EGFR (African American): 36 — ABNORMAL LOW
EGFR (Non-African Amer.): 31 — ABNORMAL LOW
Glucose: 120 mg/dL — ABNORMAL HIGH (ref 65–99)
Osmolality: 300 (ref 275–301)
Sodium: 140 mmol/L (ref 136–145)

## 2011-09-03 LAB — CBC WITH DIFFERENTIAL/PLATELET
Basophil %: 0.1 %
Eosinophil #: 0 10*3/uL (ref 0.0–0.7)
Eosinophil %: 0 %
HCT: 40.9 % (ref 40.0–52.0)
HGB: 13.9 g/dL (ref 13.0–18.0)
Lymphocyte #: 1.7 10*3/uL (ref 1.0–3.6)
Lymphocyte %: 10.5 %
MCH: 30.8 pg (ref 26.0–34.0)
MCHC: 34.1 g/dL (ref 32.0–36.0)
Monocyte #: 0.7 x10 3/mm (ref 0.2–1.0)
Monocyte %: 4.4 %
Neutrophil #: 14 10*3/uL — ABNORMAL HIGH (ref 1.4–6.5)
Neutrophil %: 85 %
RBC: 4.53 10*6/uL (ref 4.40–5.90)

## 2011-09-03 LAB — BASIC METABOLIC PANEL
Anion Gap: 10 (ref 7–16)
BUN: 68 mg/dL — ABNORMAL HIGH (ref 7–18)
Calcium, Total: 8.5 mg/dL (ref 8.5–10.1)
Chloride: 103 mmol/L (ref 98–107)
Co2: 28 mmol/L (ref 21–32)
Creatinine: 1.8 mg/dL — ABNORMAL HIGH (ref 0.60–1.30)
EGFR (Non-African Amer.): 37 — ABNORMAL LOW
Glucose: 120 mg/dL — ABNORMAL HIGH (ref 65–99)
Potassium: 4.4 mmol/L (ref 3.5–5.1)
Sodium: 141 mmol/L (ref 136–145)

## 2011-09-05 ENCOUNTER — Emergency Department: Payer: Self-pay | Admitting: Emergency Medicine

## 2011-09-05 LAB — COMPREHENSIVE METABOLIC PANEL
Anion Gap: 12 (ref 7–16)
BUN: 64 mg/dL — ABNORMAL HIGH (ref 7–18)
Bilirubin,Total: 0.6 mg/dL (ref 0.2–1.0)
Chloride: 104 mmol/L (ref 98–107)
Co2: 24 mmol/L (ref 21–32)
Creatinine: 2.17 mg/dL — ABNORMAL HIGH (ref 0.60–1.30)
EGFR (African American): 34 — ABNORMAL LOW
Potassium: 4.5 mmol/L (ref 3.5–5.1)
SGOT(AST): 29 U/L (ref 15–37)
Total Protein: 6.9 g/dL (ref 6.4–8.2)

## 2011-09-05 LAB — CBC
HCT: 43 % (ref 40.0–52.0)
HGB: 14.4 g/dL (ref 13.0–18.0)
MCH: 30.7 pg (ref 26.0–34.0)
MCV: 92 fL (ref 80–100)
Platelet: 142 10*3/uL — ABNORMAL LOW (ref 150–440)
RDW: 15.6 % — ABNORMAL HIGH (ref 11.5–14.5)

## 2011-09-05 LAB — CK TOTAL AND CKMB (NOT AT ARMC)
CK, Total: 29 U/L — ABNORMAL LOW (ref 35–232)
CK-MB: 0.6 ng/mL (ref 0.5–3.6)

## 2011-09-05 LAB — PRO B NATRIURETIC PEPTIDE: B-Type Natriuretic Peptide: 613 pg/mL — ABNORMAL HIGH (ref 0–125)

## 2011-09-05 LAB — TROPONIN I: Troponin-I: 0.02 ng/mL

## 2012-02-26 ENCOUNTER — Encounter: Payer: Self-pay | Admitting: *Deleted

## 2012-02-26 DIAGNOSIS — R0989 Other specified symptoms and signs involving the circulatory and respiratory systems: Secondary | ICD-10-CM

## 2012-03-01 ENCOUNTER — Ambulatory Visit (INDEPENDENT_AMBULATORY_CARE_PROVIDER_SITE_OTHER): Payer: Medicare PPO | Admitting: Cardiovascular Disease

## 2012-03-01 ENCOUNTER — Telehealth: Payer: Self-pay

## 2012-03-01 ENCOUNTER — Encounter: Payer: Self-pay | Admitting: Cardiovascular Disease

## 2012-03-01 VITALS — BP 152/82 | HR 99 | Ht 68.0 in | Wt 200.2 lb

## 2012-03-01 DIAGNOSIS — I447 Left bundle-branch block, unspecified: Secondary | ICD-10-CM

## 2012-03-01 DIAGNOSIS — E785 Hyperlipidemia, unspecified: Secondary | ICD-10-CM

## 2012-03-01 DIAGNOSIS — I714 Abdominal aortic aneurysm, without rupture, unspecified: Secondary | ICD-10-CM

## 2012-03-01 DIAGNOSIS — R0989 Other specified symptoms and signs involving the circulatory and respiratory systems: Secondary | ICD-10-CM

## 2012-03-01 DIAGNOSIS — I10 Essential (primary) hypertension: Secondary | ICD-10-CM

## 2012-03-01 DIAGNOSIS — I712 Thoracic aortic aneurysm, without rupture, unspecified: Secondary | ICD-10-CM

## 2012-03-01 DIAGNOSIS — I739 Peripheral vascular disease, unspecified: Secondary | ICD-10-CM

## 2012-03-01 DIAGNOSIS — R0609 Other forms of dyspnea: Secondary | ICD-10-CM

## 2012-03-01 DIAGNOSIS — R0600 Dyspnea, unspecified: Secondary | ICD-10-CM

## 2012-03-01 MED ORDER — METOPROLOL TARTRATE 50 MG PO TABS: 50.0000 mg | ORAL_TABLET | Freq: Two times a day (BID) | ORAL | Status: AC

## 2012-03-01 MED ORDER — ATORVASTATIN CALCIUM 20 MG PO TABS: 20.0000 mg | ORAL_TABLET | Freq: Every day | ORAL | Status: AC

## 2012-03-01 MED ORDER — CLOPIDOGREL BISULFATE 75 MG PO TABS: 75.0000 mg | ORAL_TABLET | Freq: Every day | ORAL | Status: AC

## 2012-03-01 MED ORDER — CLONIDINE HCL 0.1 MG PO TABS: 0.1000 mg | ORAL_TABLET | Freq: Two times a day (BID) | ORAL | Status: DC

## 2012-03-01 MED ORDER — AMLODIPINE BESYLATE 5 MG PO TABS: 5.0000 mg | ORAL_TABLET | Freq: Every day | ORAL | Status: AC

## 2012-03-01 MED ORDER — CLONIDINE HCL 0.1 MG PO TABS: 0.1000 mg | ORAL_TABLET | Freq: Two times a day (BID) | ORAL | Status: AC

## 2012-03-01 NOTE — Telephone Encounter (Signed)
Discussed with Dr. Mariah Milling who confirms he increased dose today to 1 tablet BID of clonidine

## 2012-03-01 NOTE — Assessment & Plan Note (Signed)
Cholesterol at goal one year ago. Continue current medications.

## 2012-03-01 NOTE — Assessment & Plan Note (Signed)
Blood pressure is well controlled on today's visit. No changes made to the medications. 

## 2012-03-01 NOTE — Telephone Encounter (Signed)
Pt's pharmacy called to say we sent RX for clonidine to pharm this am after OV that reads "take 1 tablet BID. Take 1/2 tablet twice daily" Calling to see if pt is to take 1 tablet BID or 1 whole tablet BID I advised we are increasing clonidine to 1 tablet BID since BP was elevated at OV. I will confirm with Dr. Mariah Milling when he comes out of room and call pharmacy/pt back if incorrect She verb. Understanding.

## 2012-03-01 NOTE — Progress Notes (Signed)
Patient ID: Stephen Weeks, male    DOB: Mar 31, 1935, 77 y.o.   MRN: [id number]  HPI Comments: 77 yo with history of smoking ( stops and starts, recently restarted), COPD, CVA, and diabetes presents for routine follow up. History of descending aorta and infrarenal aorta aneurysm.   history of coronary artery disease and prior stenting though the details are unavailable. He does report stenting in his legs.  He was seen on January 30 2011 and shortly after that was admitted to the hospital with recurrent bronchitis. Earlier that month, he had a long hospital course for pneumonia. He was found to have 4 cm descending thoracic aneurysm, 4.8 cm infrarenal AAA, also with dehydration and renal failure, severe hypertension on arrival to the emergency room with shortness of breath.  He continues to smoke one pack per day. He is no further active at baseline. No exercise. No complaints of chest pain. He does have cramping in his legs at nighttime, occasionally with walking on the left. Family is concerned about his aneurysm.   ECG: Left bundle branch block with rate 99 beats per minute  February 02, 2011 total cholesterol 118, LDL 64, no recent lipid panel Echocardiogram February 01, 2011 shows ejection fraction 50%, mitral valve regurgitation    Outpatient Encounter Prescriptions as of 03/01/2012  Medication Sig Dispense Refill  . allopurinol (ZYLOPRIM) 300 MG tablet Take 300 mg by mouth daily.        Marland Kitchen amLODipine (NORVASC) 5 MG tablet Take 5 mg by mouth daily.       Marland Kitchen aspirin 81 MG tablet Take 81 mg by mouth daily.        Marland Kitchen atorvastatin (LIPITOR) 20 MG tablet Take 20 mg by mouth daily.        . budesonide-formoterol (SYMBICORT) 160-4.5 MCG/ACT inhaler Inhale 2 puffs into the lungs 2 (two) times daily.      . cloNIDine (CATAPRES) 0.1 MG tablet Take one-half tablet by mouth twice a day.       . clopidogrel (PLAVIX) 75 MG tablet Take 75 mg by mouth daily.        Marland Kitchen donepezil (ARICEPT) 10 MG tablet  Take 10 mg by mouth 2 (two) times daily.       Marland Kitchen glipiZIDE (GLUCOTROL XL) 2.5 MG 24 hr tablet Take 2.5 mg by mouth daily.        Marland Kitchen levETIRAcetam (KEPPRA) 1000 MG tablet Take 1,000 mg by mouth 2 (two) times daily.      . Loratadine (CLARITIN) 10 MG CAPS Take by mouth.      . metoprolol (LOPRESSOR) 50 MG tablet Take 50 mg by mouth 2 (two) times daily.        . mirtazapine (REMERON) 15 MG tablet Take 15 mg by mouth at bedtime.       . Naproxen Sodium (ALEVE) 220 MG CAPS Take by mouth.        . sertraline (ZOLOFT) 100 MG tablet Take 100 mg by mouth daily.        . Tamsulosin HCl (FLOMAX) 0.4 MG CAPS Take 0.4 mg by mouth daily.       Marland Kitchen tiotropium (SPIRIVA) 18 MCG inhalation capsule Place 18 mcg into inhaler and inhale daily.        Review of Systems  HENT: Negative.   Eyes: Negative.   Respiratory: Positive for shortness of breath.   Cardiovascular: Negative.   Gastrointestinal: Negative.   Musculoskeletal: Negative.   Skin: Negative.   Neurological: Negative.  Hematological: Negative.   Psychiatric/Behavioral: Negative.   All other systems reviewed and are negative.    BP 152/82  Pulse 99  Ht 5\' 8"  (1.727 m)  Wt 200 lb 4 oz (90.833 kg)  BMI 30.45 kg/m2  Physical Exam  Nursing note and vitals reviewed. Constitutional: He is oriented to person, place, and time. He appears well-developed and well-nourished.  HENT:  Head: Normocephalic.  Nose: Nose normal.  Mouth/Throat: Oropharynx is clear and moist.  Eyes: Conjunctivae normal are normal. Pupils are equal, round, and reactive to light.  Neck: Normal range of motion. Neck supple. No JVD present. Carotid bruit is present.  Cardiovascular: Normal rate, regular rhythm, S1 normal, S2 normal and intact distal pulses.  Exam reveals no gallop and no friction rub.   Murmur heard.  Crescendo systolic murmur is present with a grade of 1/6  Pulmonary/Chest: Effort normal. No respiratory distress. He has decreased breath sounds. He has no  wheezes. He has no rales. He exhibits no tenderness.  Abdominal: Soft. Bowel sounds are normal. He exhibits no distension. There is no tenderness.  Musculoskeletal: Normal range of motion. He exhibits no edema and no tenderness.  Lymphadenopathy:    He has no cervical adenopathy.  Neurological: He is alert and oriented to person, place, and time. Coordination normal.  Skin: Skin is warm and dry. No rash noted. No erythema.  Psychiatric: He has a normal mood and affect. His behavior is normal. Judgment and thought content normal.           Assessment and Plan

## 2012-03-01 NOTE — Assessment & Plan Note (Signed)
We will repeat aortic ultrasound to evaluate his aneurysm.

## 2012-03-01 NOTE — Assessment & Plan Note (Signed)
Shortness of breath likely from underlying COPD. He continues to smoke. If symptoms get worse, would do ischemia workup.

## 2012-03-01 NOTE — Assessment & Plan Note (Signed)
Repeat lower extremity ultrasound ordered for claudication and known CAD.

## 2012-03-01 NOTE — Patient Instructions (Addendum)
You are doing well. No medication changes were made.  We will check your aorta and leg ultrasound  Please call us if you have new issues that need to be addressed before your next appt.  Your physician wants you to follow-up in: 6 months.  You will receive a reminder letter in the mail two months in advance. If you don't receive a letter, please call our office to schedule the follow-up appointment.

## 2012-03-11 ENCOUNTER — Encounter (INDEPENDENT_AMBULATORY_CARE_PROVIDER_SITE_OTHER): Payer: Medicare PPO

## 2012-03-11 DIAGNOSIS — I714 Abdominal aortic aneurysm, without rupture, unspecified: Secondary | ICD-10-CM

## 2012-03-11 DIAGNOSIS — I739 Peripheral vascular disease, unspecified: Secondary | ICD-10-CM

## 2012-03-30 ENCOUNTER — Ambulatory Visit: Payer: Self-pay | Admitting: Family Medicine

## 2012-04-01 ENCOUNTER — Encounter (INDEPENDENT_AMBULATORY_CARE_PROVIDER_SITE_OTHER): Payer: Medicare PPO

## 2012-04-01 DIAGNOSIS — I739 Peripheral vascular disease, unspecified: Secondary | ICD-10-CM

## 2012-04-01 DIAGNOSIS — I714 Abdominal aortic aneurysm, without rupture: Secondary | ICD-10-CM

## 2012-04-01 DIAGNOSIS — I70219 Atherosclerosis of native arteries of extremities with intermittent claudication, unspecified extremity: Secondary | ICD-10-CM

## 2012-04-02 ENCOUNTER — Encounter: Payer: Self-pay | Admitting: *Deleted

## 2012-04-06 ENCOUNTER — Ambulatory Visit: Payer: Medicare PPO | Admitting: Cardiovascular Disease

## 2012-04-07 ENCOUNTER — Encounter: Payer: Self-pay | Admitting: *Deleted

## 2012-04-28 ENCOUNTER — Encounter: Payer: Self-pay | Admitting: *Deleted

## 2012-05-06 ENCOUNTER — Ambulatory Visit (INDEPENDENT_AMBULATORY_CARE_PROVIDER_SITE_OTHER): Payer: Medicare PPO | Admitting: Cardiovascular Disease

## 2012-05-06 ENCOUNTER — Encounter: Payer: Self-pay | Admitting: Cardiovascular Disease

## 2012-05-06 VITALS — BP 130/72 | HR 60 | Ht 67.0 in | Wt 197.0 lb

## 2012-05-06 DIAGNOSIS — I714 Abdominal aortic aneurysm, without rupture, unspecified: Secondary | ICD-10-CM | POA: Insufficient documentation

## 2012-05-06 DIAGNOSIS — I739 Peripheral vascular disease, unspecified: Secondary | ICD-10-CM

## 2012-05-06 DIAGNOSIS — I447 Left bundle-branch block, unspecified: Secondary | ICD-10-CM

## 2012-05-06 DIAGNOSIS — I10 Essential (primary) hypertension: Secondary | ICD-10-CM

## 2012-05-06 NOTE — Progress Notes (Signed)
HPI  This is a 77 year old male who was referred by Dr. Mariah Milling for evaluation and management of peripheral arterial disease. He has prolonged history of smoking , COPD, CVA, coronary artery disease, abdominal aortic aneurysm and diabetes.  He was seen on January 30 2011 and shortly after that was admitted to the hospital with recurrent bronchitis. Earlier that month, he had a long hospital course for pneumonia.  Most recent evaluation for his abdominal aneurysm aDiameter 4.9 cm which overall has been stable. He complained of bilateral calf discomfort with walking and thus was referred for an ABI which was 0.76 on the right side and 0.60 on the left side. Arterial duplex showed long occlusion of the SFA bilaterally with collaterals. The patient complains of significant back discomfort radiating down to his legs. He had previous back surgery significant limitations. He uses a cane with walking. Some of his symptoms improved with changing position. The walking distance is variable. He feels more discomfort involving the right leg than the left leg. He does complain of bilateral calf discomfort with walking.  Allergies  Allergen Reactions  . Levofloxacin     dizziness     Current Outpatient Prescriptions on File Prior to Visit  Medication Sig Dispense Refill  . allopurinol (ZYLOPRIM) 300 MG tablet Take 300 mg by mouth daily.        Marland Kitchen amLODipine (NORVASC) 5 MG tablet Take 1 tablet (5 mg total) by mouth daily.  90 tablet  3  . aspirin 81 MG tablet Take 81 mg by mouth daily.        Marland Kitchen atorvastatin (LIPITOR) 20 MG tablet Take 1 tablet (20 mg total) by mouth daily.  90 tablet  3  . budesonide-formoterol (SYMBICORT) 160-4.5 MCG/ACT inhaler Inhale 2 puffs into the lungs 2 (two) times daily.      . cloNIDine (CATAPRES) 0.1 MG tablet Take 1 tablet (0.1 mg total) by mouth 2 (two) times daily.  180 tablet  3  . clopidogrel (PLAVIX) 75 MG tablet Take 1 tablet (75 mg total) by mouth daily.  90 tablet  3  .  glipiZIDE (GLUCOTROL XL) 2.5 MG 24 hr tablet Take 2.5 mg by mouth daily.        . Loratadine (CLARITIN) 10 MG CAPS Take by mouth.      . metoprolol (LOPRESSOR) 50 MG tablet Take 1 tablet (50 mg total) by mouth 2 (two) times daily.  180 tablet  3  . mirtazapine (REMERON) 15 MG tablet Take 15 mg by mouth at bedtime.       . sertraline (ZOLOFT) 100 MG tablet Take 100 mg by mouth daily.        . Tamsulosin HCl (FLOMAX) 0.4 MG CAPS Take 0.4 mg by mouth daily.       Marland Kitchen tiotropium (SPIRIVA) 18 MCG inhalation capsule Place 18 mcg into inhaler and inhale daily.       No current facility-administered medications on file prior to visit.     Past Medical History  Diagnosis Date  . HTN (hypertension)   . CVA (cerebrovascular accident)   . Diabetes mellitus   . Irregular heart rate   . Anxiety   . CAD (coronary artery disease)     s/p stent placement   . Seizures   . MI (myocardial infarction)   . CVA (cerebral infarction)   . Dysthymia   . Chronic bronchitis   . ASHD (arteriosclerotic heart disease)   . Gout   . PAD (peripheral artery  disease)     Bilateral occlusion of the SFA with mild to moderate decrease in ABI     Past Surgical History  Procedure Laterality Date  . Appendectomy    . Cholecystectomy    . Cardiac catheterization      s/p stent placement  . Skin cancer excision      nose  . Hernia repair    . Discoctomy l spine       Family History  Problem Relation Age of Onset  . Coronary artery disease Neg Hx   . Diabetes      positive      History   Social History  . Marital Status: Widowed    Spouse Name: N/A    Number of Children: N/A  . Years of Education: N/A   Occupational History  . Not on file.   Social History Main Topics  . Smoking status: Current Every Day Smoker -- 0.50 packs/day for 56 years    Types: Cigarettes  . Smokeless tobacco: Not on file  . Alcohol Use: No  . Drug Use: No  . Sexually Active: Not on file   Other Topics Concern  .  Not on file   Social History Narrative  . No narrative on file     PHYSICAL EXAM   BP 130/72  Pulse 60  Ht 5\' 7"  (1.702 m)  Wt 197 lb (89.359 kg)  BMI 30.85 kg/m2 Constitutional: He is oriented to person, place, and time. He appears well-developed and well-nourished. No distress.  HENT: No nasal discharge.  Head: Normocephalic and atraumatic.  Eyes: Pupils are equal and round. Right eye exhibits no discharge. Left eye exhibits no discharge.  Neck: Normal range of motion. Neck supple. No JVD present. No thyromegaly present.  Cardiovascular: Normal rate, regular rhythm, normal heart sounds and. Exam reveals no gallop and no friction rub. No murmur heard.  Pulmonary/Chest: Effort normal and breath sounds normal. No stridor. No respiratory distress. He has no wheezes. He has no rales. He exhibits no tenderness.  Abdominal: Soft. Bowel sounds are normal. He exhibits no distension. There is no tenderness. There is no rebound and no guarding.  Musculoskeletal: Normal range of motion. He exhibits no edema and no tenderness.  Neurological: He is alert and oriented to person, place, and time. Coordination normal.  Skin: Skin is warm and dry. No rash noted. He is not diaphoretic. No erythema. No pallor.  Psychiatric: He has a normal mood and affect. His behavior is normal. Judgment and thought content normal.  Vascular: Femoral pulses are normal bilaterally. Distal pulses are not palpable.     ASSESSMENT AND PLAN

## 2012-05-06 NOTE — Patient Instructions (Addendum)
Continue same medications.  Follow up in 6 months.  

## 2012-05-06 NOTE — Assessment & Plan Note (Signed)
Most recent diameter was 4.9 cm. He is overall asymptomatic. This is being monitored every 6 months. He seems to be getting close to needing intervention. He is probably at the threshold of endovascular intervention.

## 2012-05-06 NOTE — Assessment & Plan Note (Signed)
The patient has bilateral occlusion of the SFA in a long segment with collaterals. ABI was mildly reduced on the right side and moderately reduced on the left side. His symptoms seem to be worse on the right side and I suspect that a lot of his symptoms are related to possible spinal stenosis. He seems to have claudication but overall this seems to be mild. Thus, I recommend continuing medical therapy. The occlusion the SFA seems to be chronic and very long segment. The chance of success with endovascular intervention is overall low with poor long-term patency.  I had a long discussion with him about the importance of smoking cessation in order to prevent further progression of his peripheral arterial disease. Otherwise, he is on good medical therapy.

## 2012-05-12 ENCOUNTER — Emergency Department: Payer: Self-pay | Admitting: Emergency Medicine

## 2012-05-12 LAB — COMPREHENSIVE METABOLIC PANEL
Anion Gap: 6 — ABNORMAL LOW (ref 7–16)
Bilirubin,Total: 0.5 mg/dL (ref 0.2–1.0)
Calcium, Total: 8.6 mg/dL (ref 8.5–10.1)
Chloride: 107 mmol/L (ref 98–107)
EGFR (African American): 46 — ABNORMAL LOW
Glucose: 85 mg/dL (ref 65–99)
Osmolality: 279 (ref 275–301)
Potassium: 4.1 mmol/L (ref 3.5–5.1)
SGOT(AST): 15 U/L (ref 15–37)
SGPT (ALT): 13 U/L (ref 12–78)
Total Protein: 7.2 g/dL (ref 6.4–8.2)

## 2012-05-12 LAB — URINALYSIS, COMPLETE
Bilirubin,UR: NEGATIVE
Blood: NEGATIVE
Glucose,UR: NEGATIVE mg/dL (ref 0–75)
Ketone: NEGATIVE
Leukocyte Esterase: NEGATIVE
Protein: NEGATIVE
WBC UR: 1 /HPF (ref 0–5)

## 2012-05-12 LAB — CBC
HCT: 43.4 % (ref 40.0–52.0)
HGB: 15 g/dL (ref 13.0–18.0)
MCV: 90 fL (ref 80–100)
Platelet: 156 10*3/uL (ref 150–440)
RBC: 4.82 10*6/uL (ref 4.40–5.90)
RDW: 14.3 % (ref 11.5–14.5)
WBC: 9.7 10*3/uL (ref 3.8–10.6)

## 2012-05-12 LAB — TROPONIN I: Troponin-I: <0.02 ng/mL

## 2012-05-12 LAB — DIFFERENTIAL
Basophil #: 0.1 10*3/uL (ref 0.0–0.1)
Basophil %: 1 %
Lymphocyte %: 21.9 %
Monocyte #: 0.7 x10 3/mm (ref 0.2–1.0)

## 2012-05-12 LAB — SEDIMENTATION RATE: Erythrocyte Sed Rate: 18 mm/hr (ref 0–20)

## 2012-05-23 ENCOUNTER — Emergency Department: Payer: Self-pay | Admitting: Emergency Medicine

## 2012-05-23 LAB — CBC
HCT: 41.5 % (ref 40.0–52.0)
MCHC: 34.5 g/dL (ref 32.0–36.0)
MCV: 89 fL (ref 80–100)
RBC: 4.69 10*6/uL (ref 4.40–5.90)
RDW: 14.1 % (ref 11.5–14.5)
WBC: 9 10*3/uL (ref 3.8–10.6)

## 2012-05-23 LAB — COMPREHENSIVE METABOLIC PANEL
Albumin: 3.5 g/dL (ref 3.4–5.0)
BUN: 18 mg/dL (ref 7–18)
Bilirubin,Total: 0.4 mg/dL (ref 0.2–1.0)
Calcium, Total: 8.5 mg/dL (ref 8.5–10.1)
Chloride: 108 mmol/L — ABNORMAL HIGH (ref 98–107)
Co2: 26 mmol/L (ref 21–32)
EGFR (African American): 43 — ABNORMAL LOW
EGFR (Non-African Amer.): 37 — ABNORMAL LOW
Glucose: 122 mg/dL — ABNORMAL HIGH (ref 65–99)
Potassium: 4.1 mmol/L (ref 3.5–5.1)
SGPT (ALT): 13 U/L (ref 12–78)
Total Protein: 6.8 g/dL (ref 6.4–8.2)

## 2012-05-23 LAB — TROPONIN I: Troponin-I: <0.02 ng/mL

## 2012-05-23 LAB — CK TOTAL AND CKMB (NOT AT ARMC)
CK, Total: 80 U/L (ref 35–232)
CK-MB: 1.1 ng/mL (ref 0.5–3.6)

## 2012-08-02 ENCOUNTER — Emergency Department: Payer: Self-pay | Admitting: Emergency Medicine

## 2012-08-02 LAB — URINALYSIS, COMPLETE
Bacteria: NONE SEEN
Bilirubin,UR: NEGATIVE
Glucose,UR: NEGATIVE mg/dL (ref 0–75)
Hyaline Cast: 15
Leukocyte Esterase: NEGATIVE
Nitrite: NEGATIVE
Protein: 100
RBC,UR: 2 /HPF (ref 0–5)
Squamous Epithelial: <1
WBC UR: 1 /HPF (ref 0–5)

## 2012-08-02 LAB — CBC
HCT: 40.9 % (ref 40.0–52.0)
HGB: 14.2 g/dL (ref 13.0–18.0)
MCH: 30.3 pg (ref 26.0–34.0)
MCV: 87 fL (ref 80–100)
RBC: 4.7 10*6/uL (ref 4.40–5.90)
RDW: 13.9 % (ref 11.5–14.5)

## 2012-08-02 LAB — COMPREHENSIVE METABOLIC PANEL
Alkaline Phosphatase: 105 U/L (ref 50–136)
BUN: 15 mg/dL (ref 7–18)
Bilirubin,Total: 0.4 mg/dL (ref 0.2–1.0)
Co2: 28 mmol/L (ref 21–32)
Creatinine: 1.87 mg/dL — ABNORMAL HIGH (ref 0.60–1.30)
EGFR (African American): 40 — ABNORMAL LOW
Glucose: 132 mg/dL — ABNORMAL HIGH (ref 65–99)
SGPT (ALT): 12 U/L (ref 12–78)
Total Protein: 7 g/dL (ref 6.4–8.2)

## 2012-08-02 LAB — CK TOTAL AND CKMB (NOT AT ARMC): CK, Total: 63 U/L (ref 35–232)

## 2012-09-13 ENCOUNTER — Other Ambulatory Visit: Payer: Self-pay | Admitting: *Deleted

## 2012-09-15 ENCOUNTER — Telehealth: Payer: Self-pay | Admitting: *Deleted

## 2012-09-15 NOTE — Telephone Encounter (Signed)
Received 30 day refill request for Amlodipine 5mg , Aspirin 81 mg, Clopidogrel 75 mg and Metoprolol Tartrate 50 mg verbally called in to [facility]. Spiriva and symbicort refill request should be sent to PCP.

## 2013-04-11 ENCOUNTER — Telehealth: Payer: Self-pay | Admitting: *Deleted

## 2013-04-11 IMAGING — CR DG CHEST 2V
1 series · 3 of 3 positions shown · non-contrast
Comparison: none

REASON FOR EXAM: SOB
COMMENTS:

[Series 1: ap · 0.17mm/px · 3 of 3 slices shown]
[im 1/3]
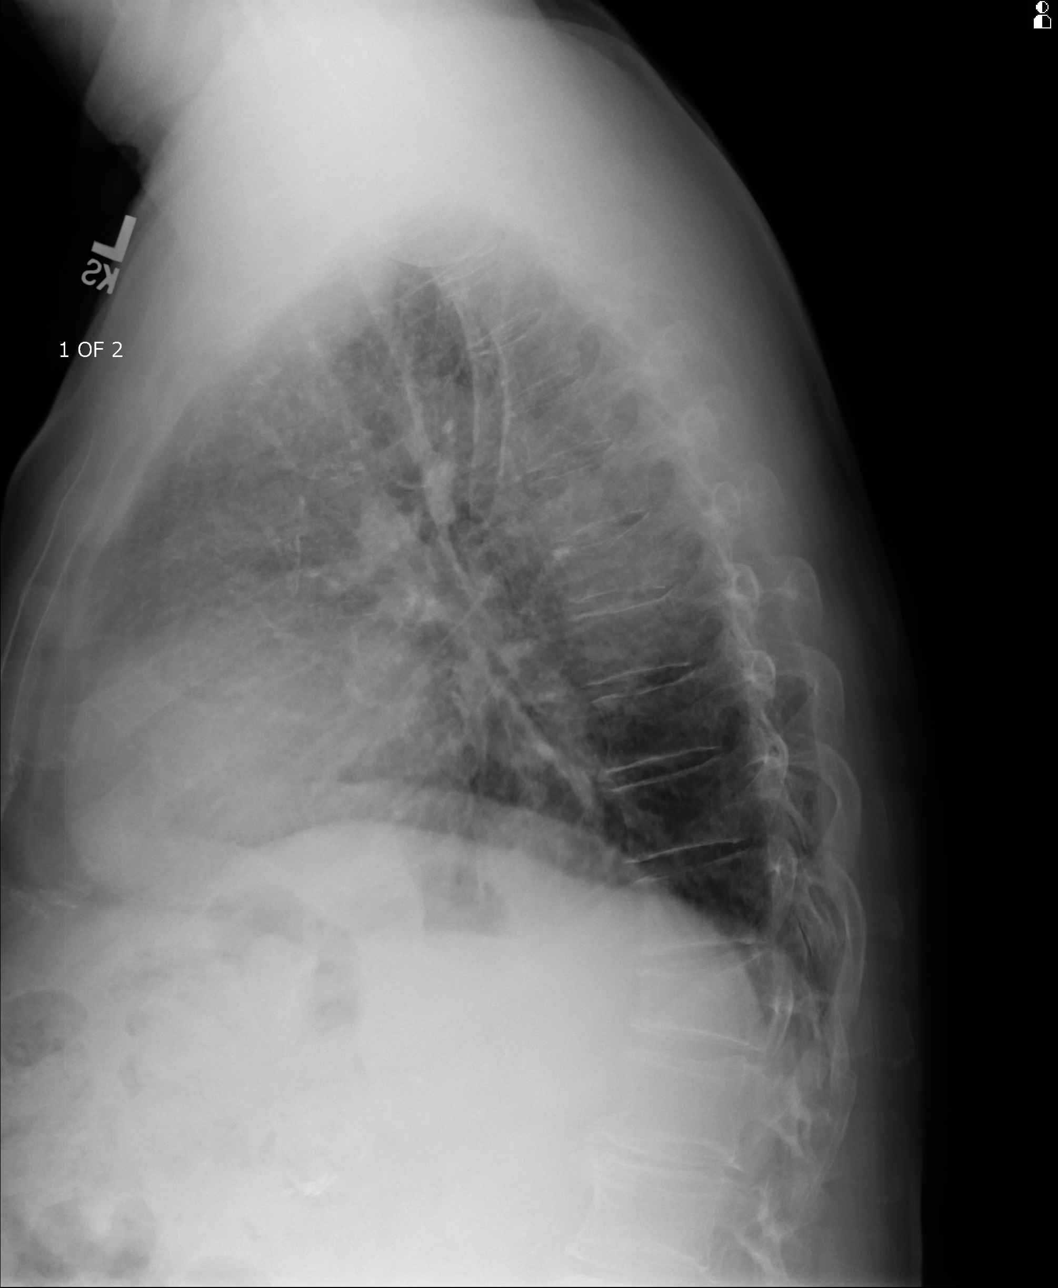
[im 2/3]
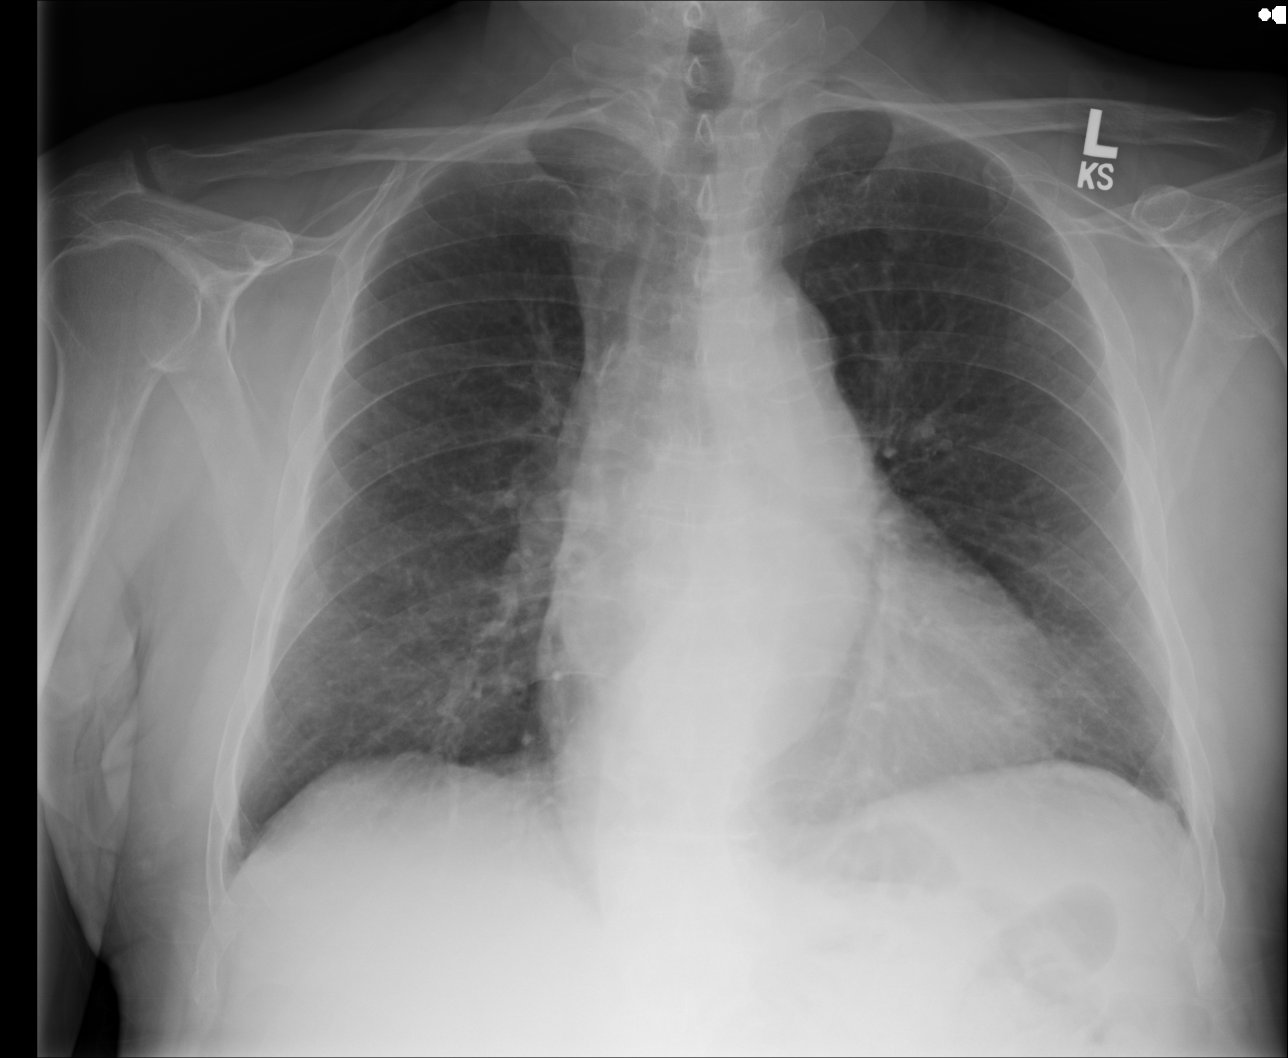
[im 3/3]
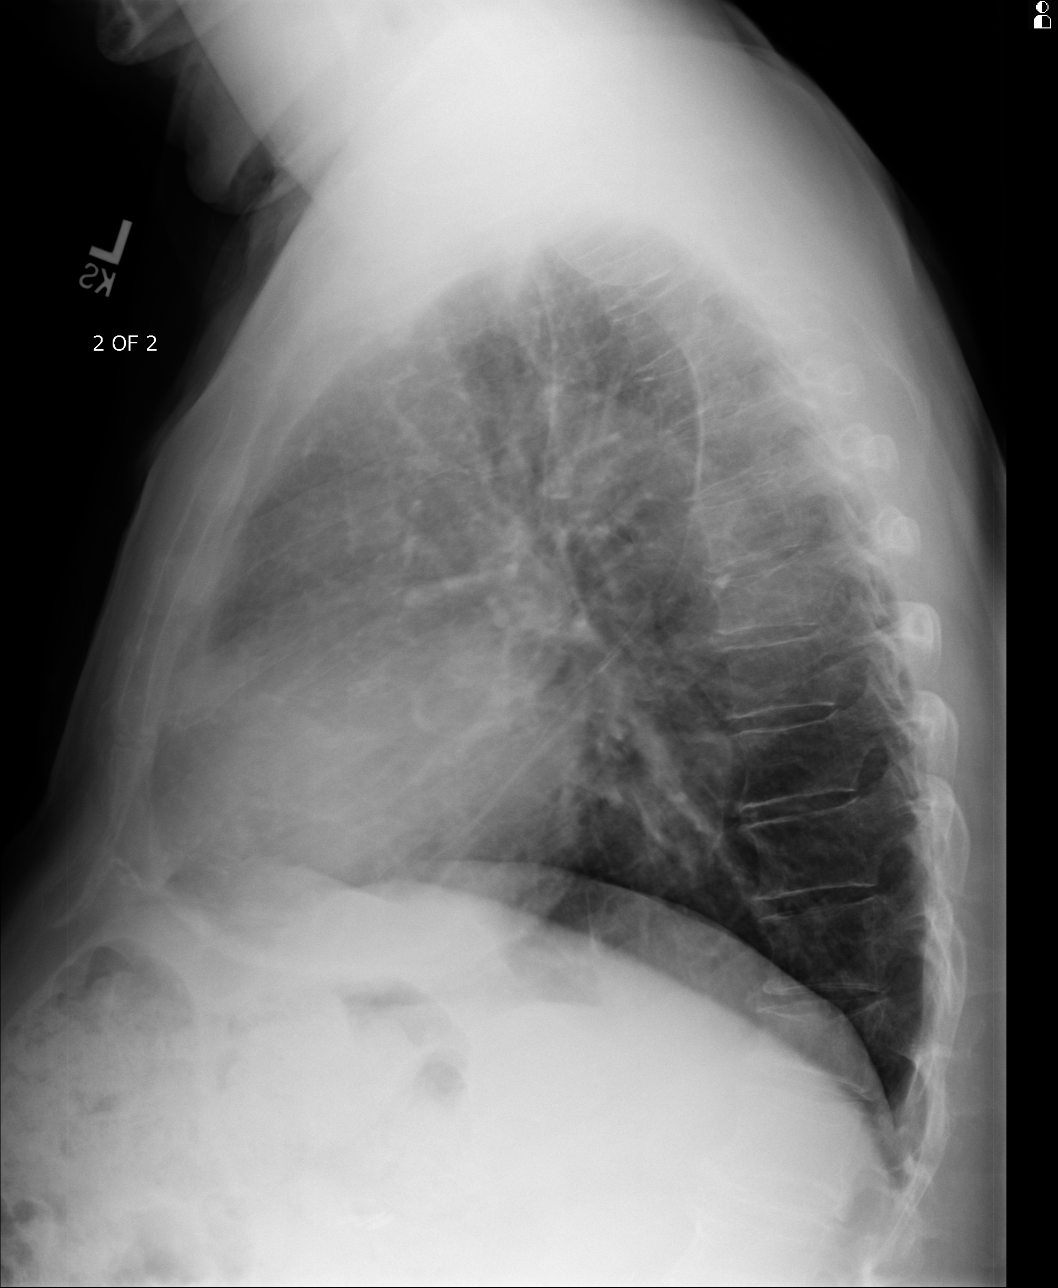

[3 of 3 positions shown; findings below may reference images not displayed]

PROCEDURE:     DXR - DXR CHEST PA (OR AP) AND LATERAL  - January 19, 2011  [DATE]

RESULT:     The patient has taken a shallow inspiration. The cardiac
silhouette is enlarged. There is no evidence of focal infiltrates, effusions
or edema. The cardiac silhouette is enlarged indicative of cardiomegaly. The
visualized bony skeleton is unremarkable. A vague nodular density projects
in the left upper lobe. This may represent an exophytic osteophyte. A
pulmonary nodule cannot be excluded. Repeat PA and lateral view is
recommended. If area persists, further evaluation with chest CT is
recommended.
IMPRESSION: 1. Vague nodular density left upper lobe as described above.
2. Otherwise, no evidence of acute cardiopulmonary disease.

## 2013-04-11 NOTE — Telephone Encounter (Signed)
Unable to lmom due to mailbox full. Time to schedule carotid doppler (2 yr f/u)

## 2013-04-14 ENCOUNTER — Telehealth: Payer: Self-pay | Admitting: *Deleted

## 2013-04-14 NOTE — Telephone Encounter (Signed)
Bad telephone. Called Dr. Christell Faithrissman's office to verify phone number and left a message that it's time for him to schedule a carotid doppler (2 yr f/u)

## 2013-11-08 IMAGING — CT CT CHEST W/O CM
1 series · 15 of 33 positions shown, 19 images · non-contrast
Comparison: none

REASON FOR EXAM: h/o aneurysm with weakness and shortness of breath cr
1.64
COMMENTS:

PROCEDURE:     CT  - CT CHEST WITHOUT CONTRAST  - August 18, 2011  [DATE]
RESULT:     Chest CT dated 6606 a comparison is made to prior study dated
02/01/2011
TECHNIQUE: Helical noncontrasted 3 mm sections were obtained from the
thoracic inlet through the lung bases.

[Series 2: soft tissue · axial · 0.77mm/px · z∈[-174,+108]mm · 15 of 112 slices shown, 19 images]
[im 9/112  mediastinal]
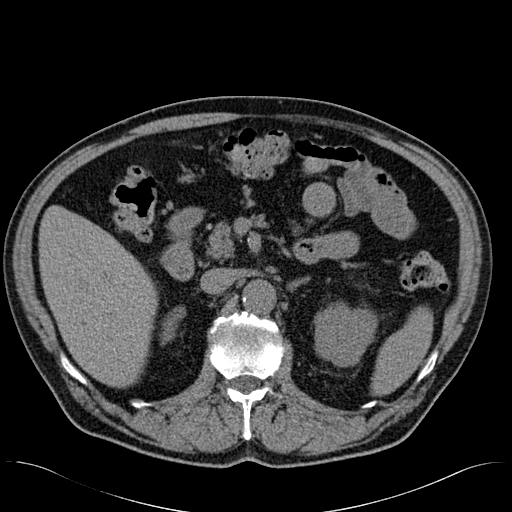
[im 9/112  lung]
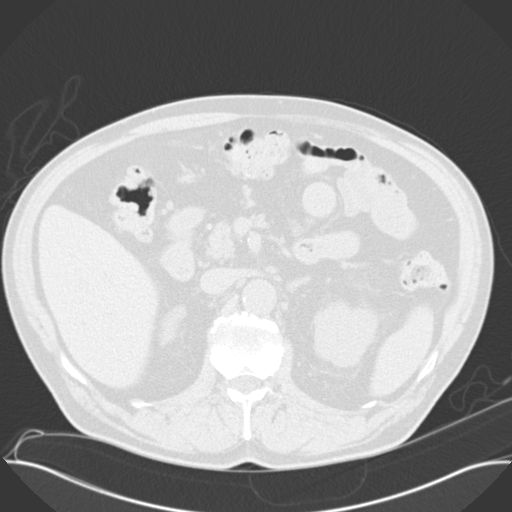
[im 17/112  lung]
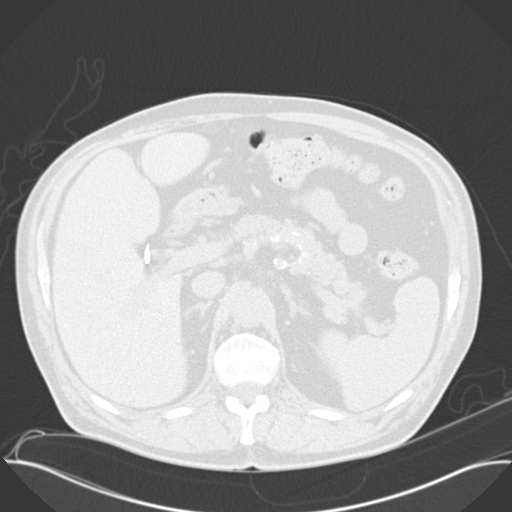
[im 23/112  lung]
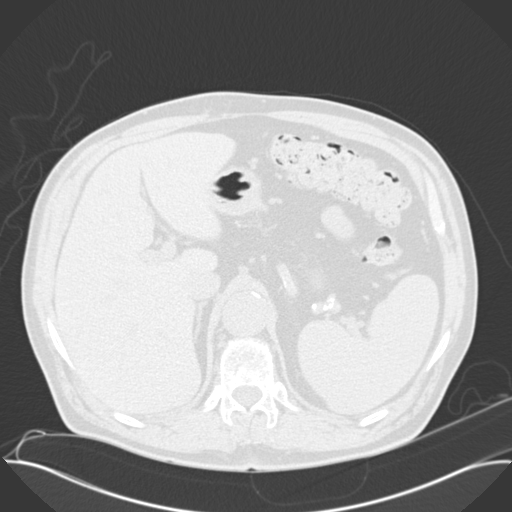
[im 29/112  lung]
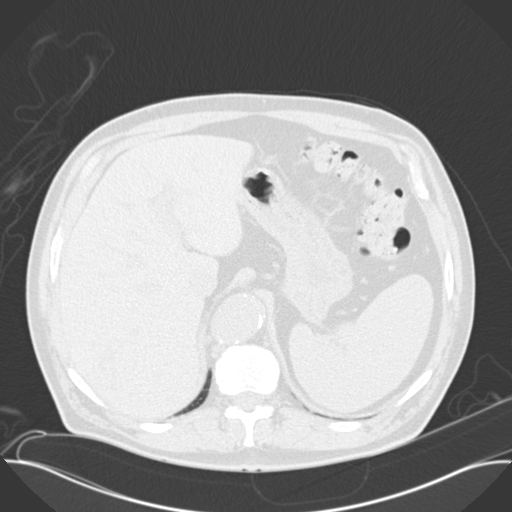
[im 38/112  mediastinal]
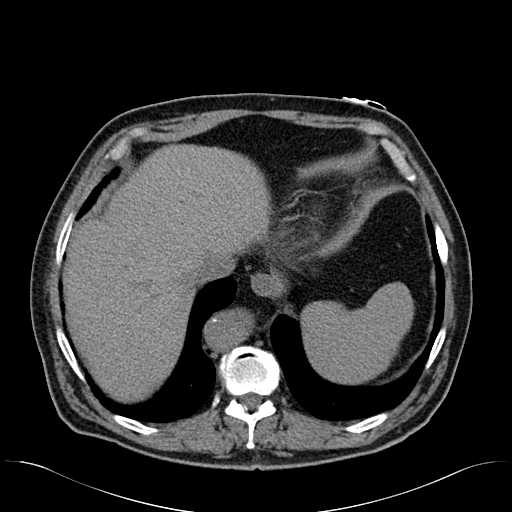
[im 38/112  lung]
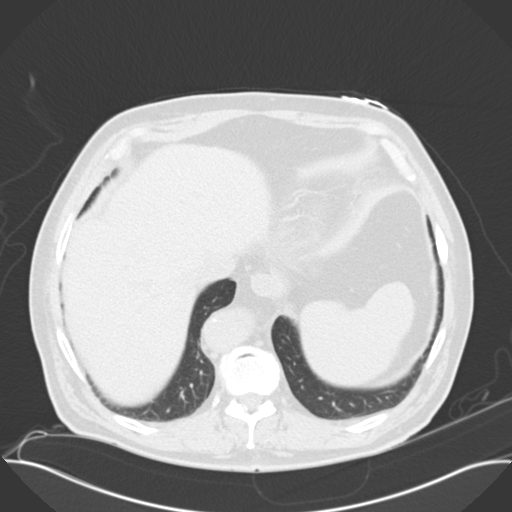
[im 45/112  lung]
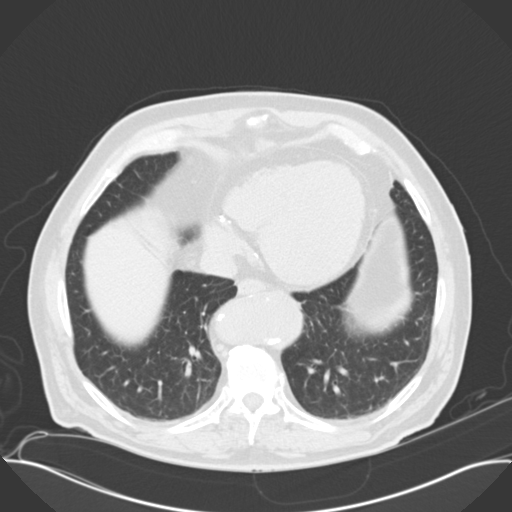
[im 50/112  lung]
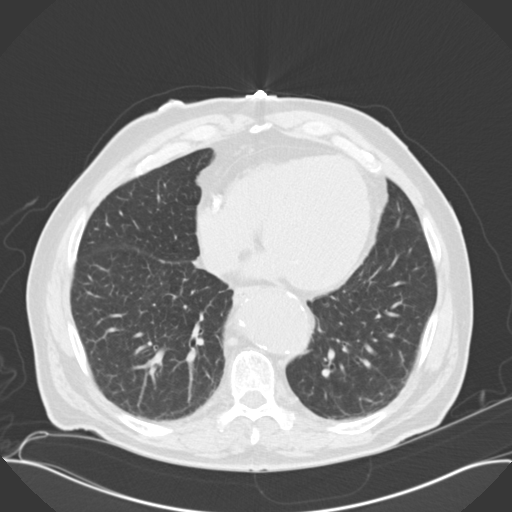
[im 58/112  lung]
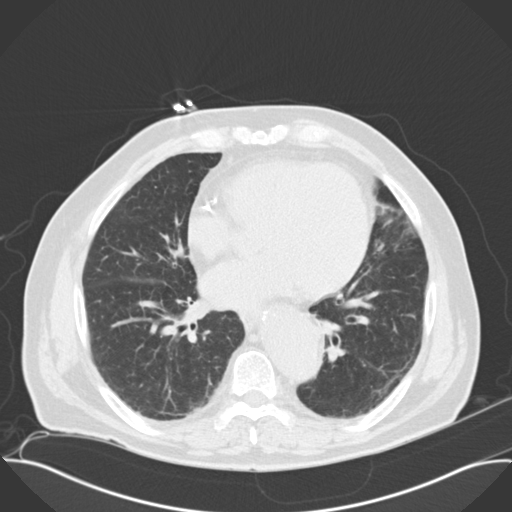
[im 62/112  mediastinal]
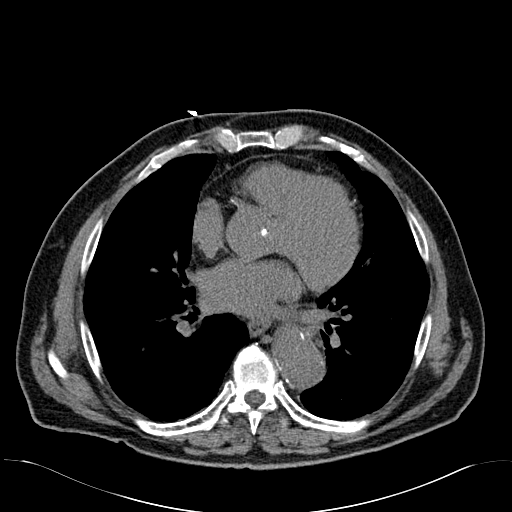
[im 62/112  lung]
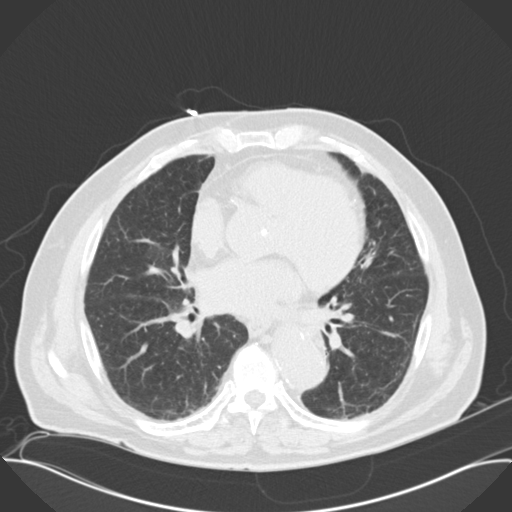
[im 67/112  lung]
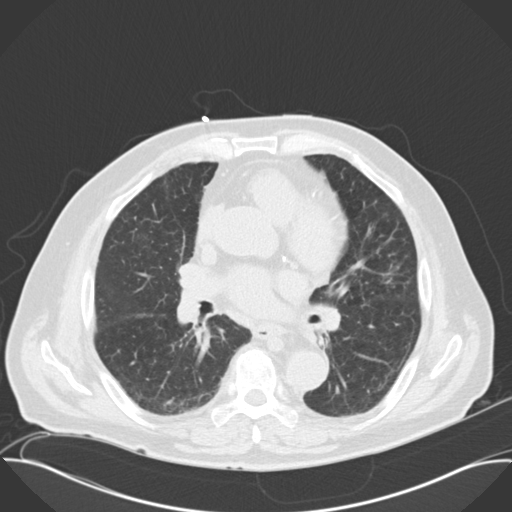
[im 75/112  lung]
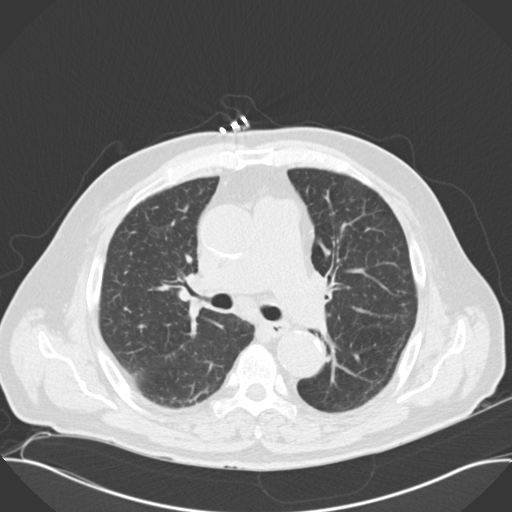
[im 83/112  lung]
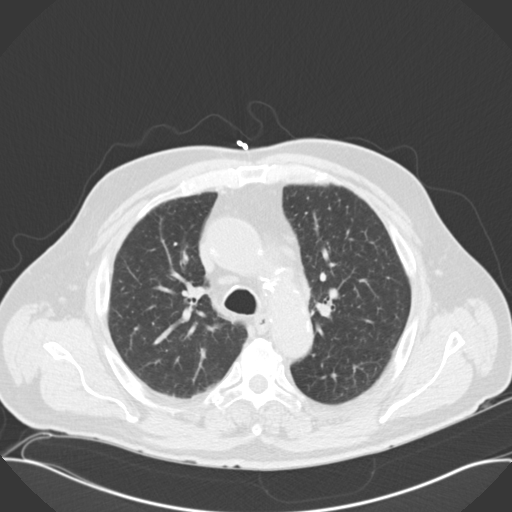
[im 89/112  mediastinal]
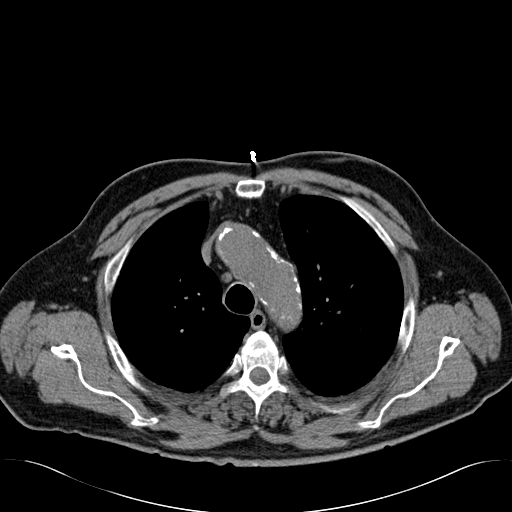
[im 89/112  lung]
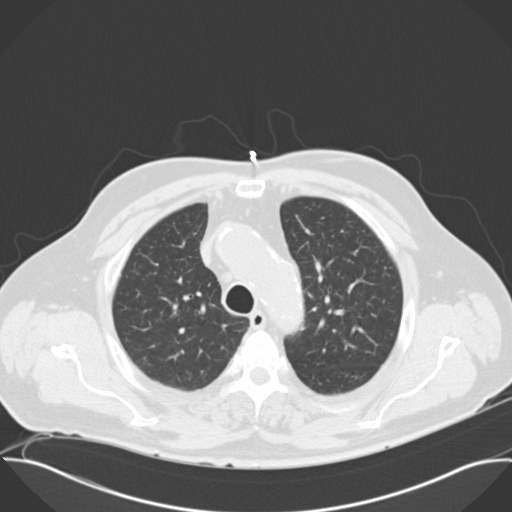
[im 95/112  lung]
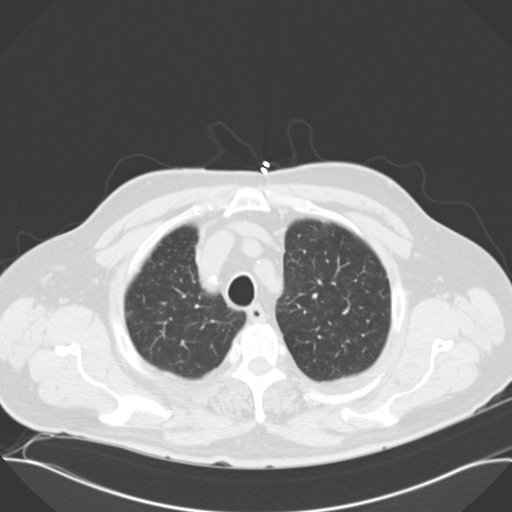
[im 103/112  lung]
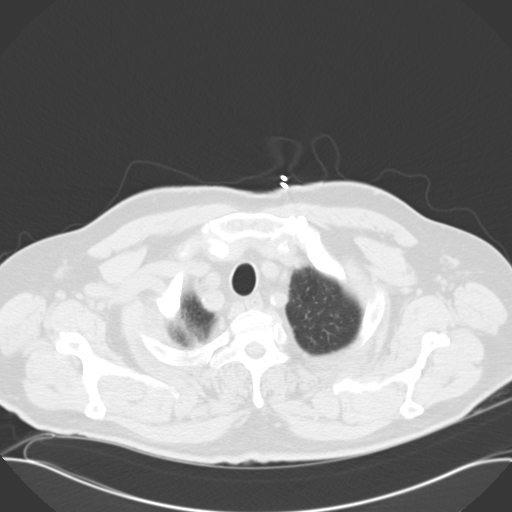

[15 of 33 positions shown; findings below may reference images not displayed]

FINDINGS: The mediastinum and hilar regions and structures demonstrate no
evidence of mediastinal nor hilar adenopathy nor masses. Stable small
subcentimeter lymph nodes appreciated within the prevascular space,
peritracheal regions, and subcarinal regions.

A descending thoracic aortic aneurysm is appreciated within the lower
portion of the thoracic aorta measuring 5.45 x 5.05 cm. This finding is
slightly more prominent than compared to previous study measuring 4.97 x
5.02 cm. A small focal outpouching is identified along the lateral aspect of
the aortic arch which when compared to the previous study is unchanged and
has the appearance of a small focal pseudoaneurysm. Is appreciated on image
#29. There is no evidence of mediastinal fluid nor evidence of pleural
effusion. Is no evidence of periaortic fluid. In the lung parenchyma
demonstrates hypoventilation within the lung bases and prominence of
interstitial markings like reflecting a component of pulmonary fibrosis. No
focal recent consolidation identified.

The visualized upper abdominal viscera demonstrate no gross abnormalities.

The previous described coronary artery findings are unchanged.
IMPRESSION: Slight increased prominence of the patient's descending
thoracic aortic aneurysm without CT evidence of rupture. The study was
performed without IV contrasted administration the presence of a dissection
cannot be evaluated.
2. Stable small likely pseudoaneurysm involving the aortic arch.

## 2013-11-08 IMAGING — US US CAROTID DUPLEX BILAT
1 series · 13 of 24 positions shown · non-contrast
Comparison: None

REASON FOR EXAM: syncope
COMMENTS:

PROCEDURE:     US  - US CAROTID DOPPLER BILATERAL  - August 18, 2011  [DATE]
RESULT:     Indication: Syncope
TECHNIQUE: Gray-scale, color Doppler, and spectral Doppler images were
obtained of the extracranial carotid artery systems and vertebral arteries
in the neck.

[Series 1: us carotid duplex bilat · 0.08mm/px · 13 of 61 slices shown]
[im 1/61]
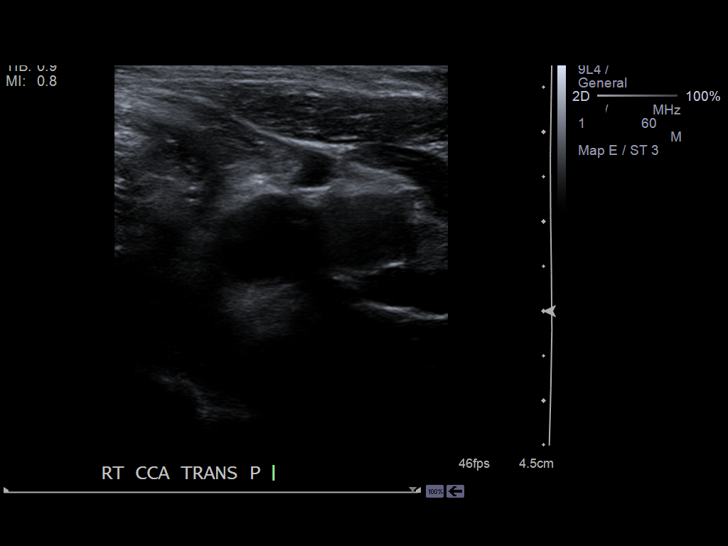
[im 6/61]
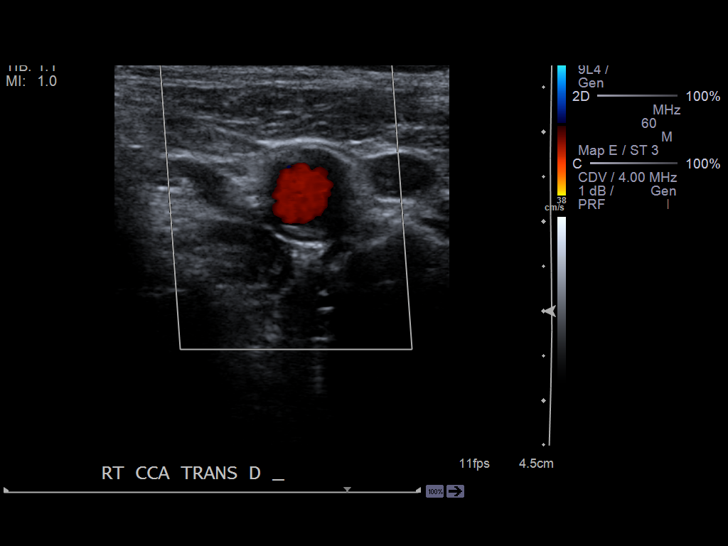
[im 11/61]
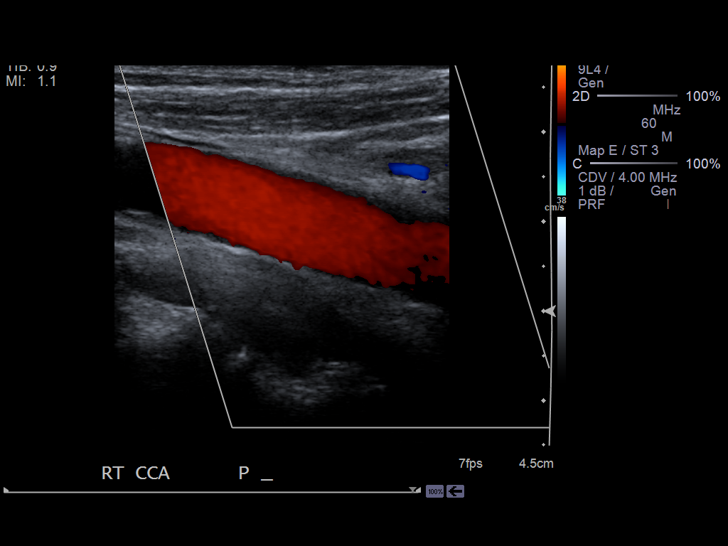
[im 16/61]
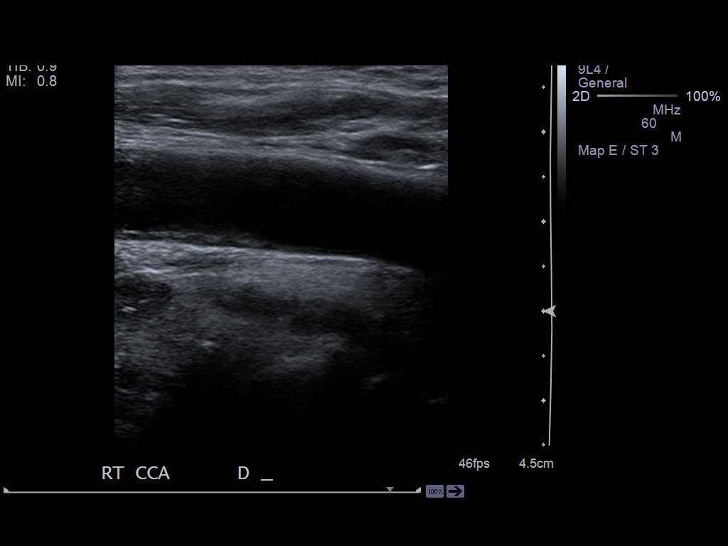
[im 21/61]
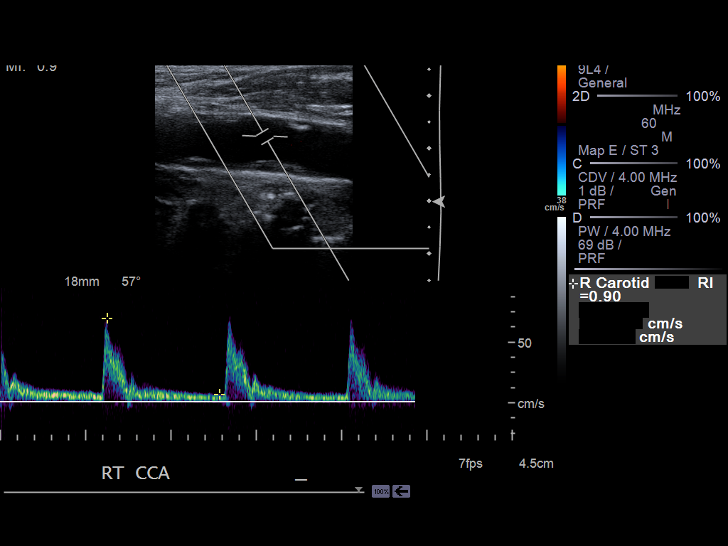
[im 27/61]
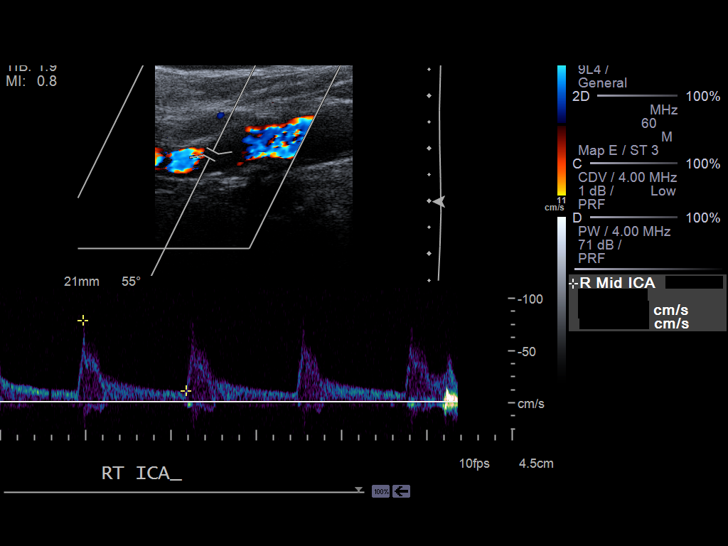
[im 32/61]
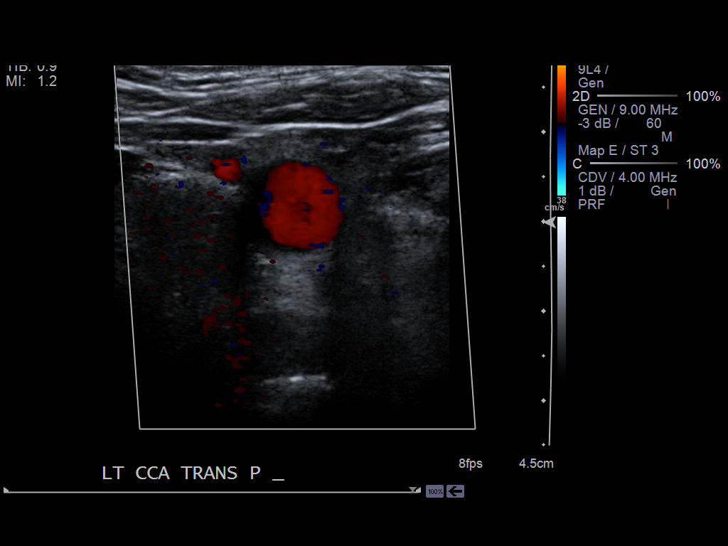
[im 34/61]
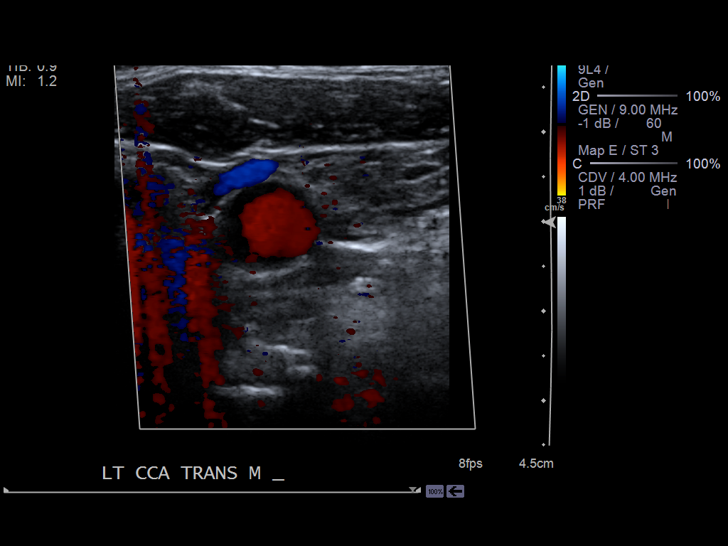
[im 40/61]
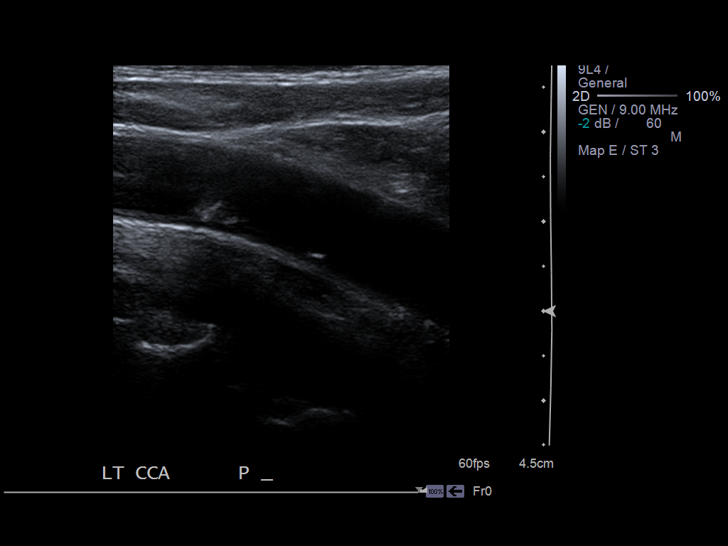
[im 45/61]
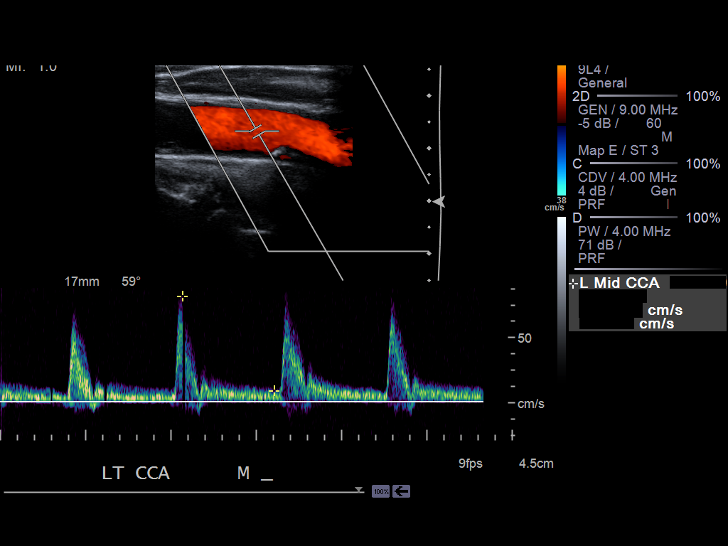
[im 50/61]
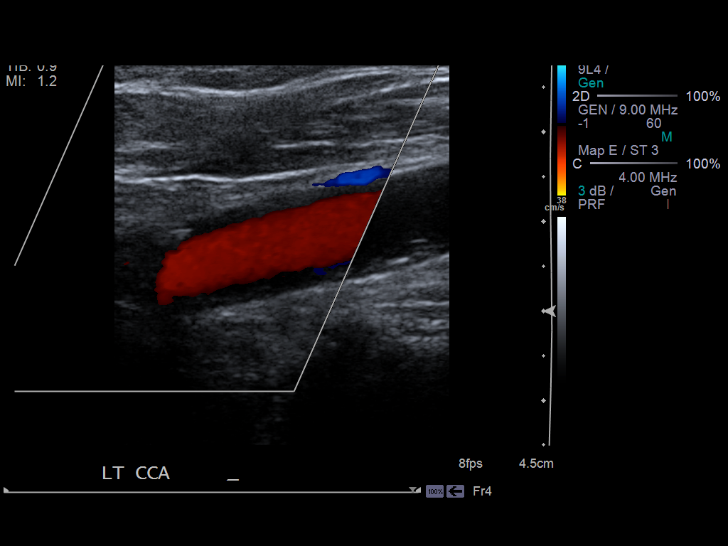
[im 55/61]
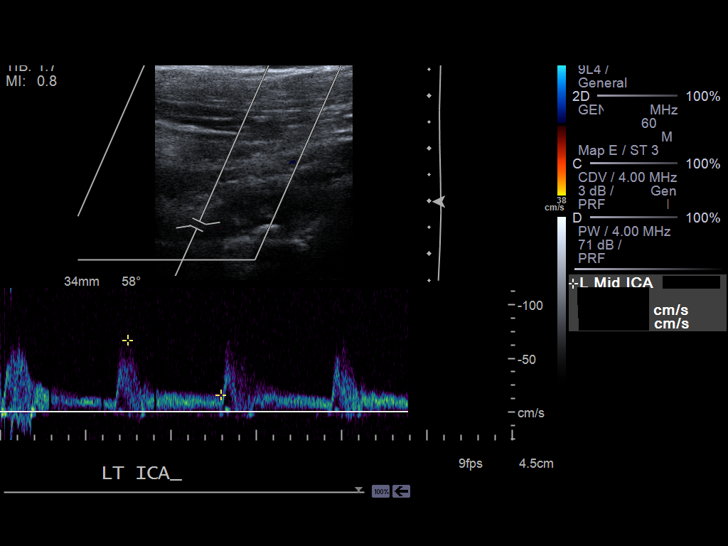
[im 61/61]
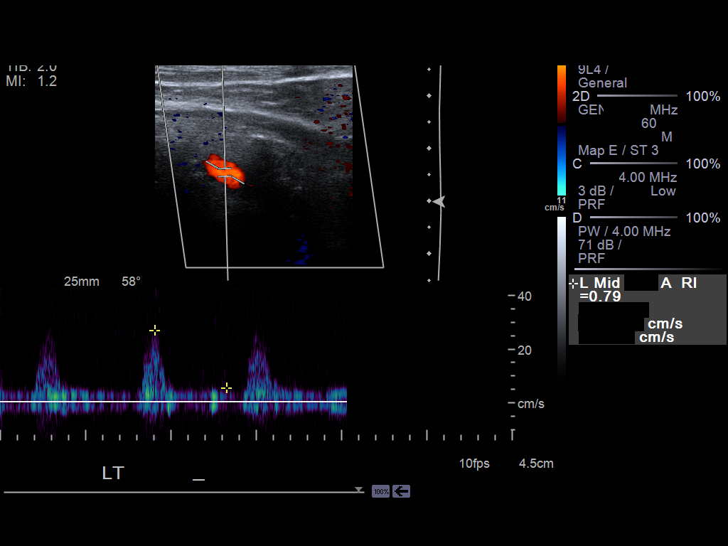

[13 of 24 positions shown; findings below may reference images not displayed]

FINDINGS: On the right, there is mild atherosclerotic plaque within the distal CCA,
carotid bulb and proximal ICA. Maximum peak systolic velocity in the right
CCA is 66 cm/second. Maximum peak systolic velocity in the right ICA is 79
cm/second. Maximum peak systolic velocity in the right ECA is 77 cm/second.
The right ICA/CCA ratio is 1.2. This corresponds to a stenosis of less than
50 %. Antegrade blood flow is documented in the right vertebral artery.

On the left, there is mild atherosclerotic plaque within the distal CCA,
carotid bulb and proximal ICA. Maximum peak systolic velocity in the left
CCA is 74 cm/second. Maximum peak systolic velocity in the left ICA is 72
cm/second. Maximum peak systolic velocity in the left ECA is 126 cm/second.
The left ICA/CCA ratio is 0.97. This corresponds to a stenosis of less than
50 %. Antegrade blood flow is documented in the left vertebral artery.
IMPRESSION: 1. No hemodynamically significant carotid artery stenosis.

[REDACTED]
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
## 2014-05-23 NOTE — Consult Note (Signed)
Referring Physician:  Loletha Grayer :   Primary Care Physician:  Loletha Grayer : [facility], [facility], [address], [place], [place] [address], [place]  Reason for Consult:  Admit Date: 26-Aug-2011   Chief Complaint: dizziness and weakness   Reason for Consult: dizziness   History of Present Illness:  History of Present Illness:   79 yo RHD M presents to hospital with difficulty breathing and has been evaluated over the past week for COPD exacerbation.  I was consulted secondary to ambulatory issues noted by physical therapy.   Of note, when pt tries to walk, sometimes he gets severe vertigo with N that lasts for a few minutes.  Per patient, this has been occuring for the past two years that he knows but these episodes are intermittent but becoming more frequent lately.  He also reports decreased hearing bilateral and ringing in his ears.  This dizziness is irregardless of position per patient.  ROS:   General weakness    HEENT no complaints    Lungs cough  SOB    Cardiac no complaints    GI nausea    GU no complaints    Musculoskeletal no complaints    Extremities no complaints    Skin no complaints    Neuro no complaints    Endocrine no complaints    Psych no complaints   Past Medical/Surgical Hx:  Brain Clips:   HTN:   Diabetes:   Atrial Fibrillation:   TIA - Transient Ischemic Attack:   ETOH:   Cholecystectomy:   Appendectomy:   Cardiac Stent:   Home Medications: Medication Instructions Last Modified Date/Time  albuterol 2 puff(s) inhaled every 6 hours, As Needed-for Shortness of Breath  23-Jul-13 13:21  atorvastatin 20 mg oral tablet 1 tab(s) orally once a day 23-Jul-13 13:21  clonidine 0.1 mg oral tablet 1 tab(s) orally every 12 hours 23-Jul-13 13:21  Keppra 1000 mg oral tablet 1 tab(s) orally every 12 hours 23-Jul-13 13:21  donepezil 10 mg oral tablet 1 tab(s) orally 2 times a day 23-Jul-13 13:21  Vitamin  B-12 1000 mcg/mL injectable solution 1 milliliter(s) injectable once a month 23-Jul-13 13:21  tamsulosin 0.4 mg oral capsule 1 cap(s) orally once a day 23-Jul-13 13:21  clopidogrel 75 mg oral tablet 1 tab(s) orally once a day 23-Jul-13 13:21  fluticasone 50 mcg/inh nasal spray 2 spray(s) nasal once a day 23-Jul-13 13:21  Symbicort 160 mcg-4.5 mcg/inh inhalation aerosol 2 puff(s) inhaled 2 times a day 23-Jul-13 13:21  Spiriva 18 mcg inhalation capsule 1 each inhaled once a day 23-Jul-13 13:21  Lexapro 10 mg oral tablet 1 tab(s) orally once a day 23-Jul-13 13:21  Remeron 15 mg oral tablet 1 tab(s) orally once a day (at bedtime) 23-Jul-13 13:21  Ambien 5 mg oral tablet 1 tab(s) orally once a day (at bedtime), As Needed Insomnia 23-Jul-13 13:21  allopurinol 300 mg oral tablet 1 tab(s) orally once a day 23-Jul-13 13:21  glipiZIDE 2.5 mg oral tablet, extended release 1 tab(s) orally once a day 23-Jul-13 13:21   Allergies:  Sulfa drugs: Rash  Vicodin: GI Distress  Social/Family History:  Employment Status: retired   Lives With: spouse   Living Arrangements: house   Social History: no tob, EtOH, no illicits   Family History: n/c   Vital Signs: **Vital Signs.:   31-Jul-13 08:21   Vital Signs Type Routine   Temperature Temperature (F) 97.8   Celsius 36.5   Temperature Source oral  Pulse source if not from Vital Sign Device per cardiac monitor   Respirations Respirations 20   Systolic BP Systolic BP 559   Diastolic BP (mmHg) Diastolic BP (mmHg) 83   Mean BP 110   Pulse Ox % Pulse Ox % 94   Pulse Ox Activity Level  At rest   Oxygen Delivery 2L    10:04   Pulse Ox % Pulse Ox % 94   Pulse Ox Activity Level  At rest   Oxygen Delivery 2L   Physical Exam:  General: pleasant, slightly overweight, no acute distress   HEENT: normocephalic, sclera nonicteric, oropharynx clear, decreased hearing (sensineural in R ear), Weber localizes slightly to L ear   Neck: supple, no JVD, no bruits    Chest: CTA B, multiple coughs and problems taking a deep breath   Cardiac: RRR, no murmurs, no edema, 2+ pulses   Extremities: no C/C/E, FROM   Neurologic Exam:  Mental Status: alert and oriented x 3, normal speech except out of breath and language, follows complex commands   Cranial Nerves: PERRLA, EOMI, nl VF, face symmetric, tongue midline, shoulder shrug equal, decreased hearing R>L   Motor Exam: 5-/5 B UE, 4/5 B LE, nl tone, no tremor   Deep Tendon Reflexes: 3+/4 except 1/4 Achilles, neg Hoffmann, downgoing Babinski   Sensory Exam: intact to pinprick, temperature, and vibration intact B   Coordination: FTN and HTS WNL, nl RAM   Lab Results: Hepatic:  23-Jul-13 13:04    Bilirubin, Total 0.5   Alkaline Phosphatase 77   SGPT (ALT) 17 (12-78 NOTE: NEW REFERENCE RANGE 12/27/2010)   SGOT (AST) 19   Total Protein, Serum 6.4   Albumin, Serum  3.1  Routine Chem:  24-Jul-13 05:17    Cholesterol, Serum 151   Triglycerides, Serum 106   HDL (INHOUSE) 42   VLDL Cholesterol Calculated 21   LDL Cholesterol Calculated 88 (Result(s) reported on 27 Aug 2011 at 05:58AM.)  27-Jul-13 05:42    B-Type Natriuretic Peptide [facility])  2576 (Result(s) reported on 30 Aug 2011 at 06:23AM.)  30-Jul-13 05:21    Result Comment cbc - SMEAR SCANNED  Result(s) reported on 02 Sep 2011 at 11:56AM.  31-Jul-13 05:51    Glucose, Serum  120   BUN  68   Creatinine (comp)  1.80   Sodium, Serum 141   Potassium, Serum 4.4   Chloride, Serum 103   CO2, Serum 28   Calcium (Total), Serum 8.5   Anion Gap 10   Osmolality (calc) 302   eGFR (African American)  42   eGFR (Non-African American)  37 (eGFR values <47m/min/1.73 m2 may be an indication of chronic kidney disease (CKD). Calculated eGFR is useful in patients with stable renal function. The eGFR calculation will not be reliable in acutely ill patients when serum creatinine is changing rapidly. It is not useful in  patients on dialysis. The eGFR  calculation may not be applicable to patients at the low and high extremes of body sizes, pregnant women, and vegetarians.)  Cardiac:  24-Jul-13 05:17    CK, Total 41   CPK-MB, Serum 1.6 (Result(s) reported on 27 Aug 2011 at 06:13AM.)   Troponin I 0.03 (0.00-0.05 0.05 ng/mL or less: NEGATIVE  Repeat testing in 3-6 hrs  if clinically indicated. >0.05 ng/mL: POTENTIAL  MYOCARDIAL INJURY. Repeat  testing in 3-6 hrs if  clinically indicated. NOTE: An increase or decrease  of 30% or more on serial  testing suggests a  clinically important change)  Routine Coag:  23-Jul-13 13:04    Activated PTT (APTT) 32.2 (A HCT value >55% may artifactually increase the APTT. In one study, the increase was an average of 19%. Reference: "Effect on Routine and Special Coagulation Testing Values of Citrate Anticoagulant Adjustment in Patients with High HCT Values." American Journal of Clinical Pathology 2006;126:400-405.)   Prothrombin 13.3   INR 1.0 (INR reference interval applies to patients on anticoagulant therapy. A single INR therapeutic range for coumarins is not optimal for all indications; however, the suggested range for most indications is 2.0 - 3.0. Exceptions to the INR Reference Range may include: Prosthetic heart valves, acute myocardial infarction, prevention of myocardial infarction, and combinations of aspirin and anticoagulant. The need for a higher or lower target INR must be assessed individually. Reference: The Pharmacology and Management of the Vitamin K  antagonists: the seventh ACCP Conference on Antithrombotic and Thrombolytic Therapy. EPPIR.5188 Sept:126 (3suppl): [id number]. A HCT value >55% may artifactually increase the PT.  In one study,  the increase was an average of 25%. Reference:  "Effect on Routine and Special Coagulation Testing Values of Citrate Anticoagulant Adjustment in Patients with High HCT Values." American Journal of Clinical Pathology 2006;126:400-405.)   Routine Hem:  31-Jul-13 05:51    WBC (CBC)  16.5   RBC (CBC) 4.53   Hemoglobin (CBC) 13.9   Hematocrit (CBC) 40.9   Platelet Count (CBC)  125   MCV 90   MCH 30.8   MCHC 34.1   RDW  15.3   Neutrophil % 85.0   Lymphocyte % 10.5   Monocyte % 4.4   Eosinophil % 0.0   Basophil % 0.1   Neutrophil #  14.0   Lymphocyte # 1.7   Monocyte # 0.7   Eosinophil # 0.0   Basophil # 0.0 (Result(s) reported on 03 Sep 2011 at 06:34AM.)   Radiology Results: CT:    28-Jul-13 15:25, CT Head Without Contrast   CT Head Without Contrast    REASON FOR EXAM:    dizzy, r/o cva  COMMENTS:       PROCEDURE: CT  - CT HEAD WITHOUT CONTRAST  - Aug 31 2011  3:25PM     RESULT: Axial noncontrast CT scanning was performed through the brain   with reconstructions at 5 mm intervals and slice thicknesses. Comparison   is made to the study of 20 August 2011.    There is chronic change of encephalomalacia in the right frontal lobe.   Similar changes are seen in the right occipital lobe. These are stable.   There is no shift of the midline. There is mild diffuse cerebral atrophy   with compensatory ventriculomegaly. There is evidence of a previous   craniectomy in the right temporal region. There is no evidence of an   acute ischemic or hemorrhagic event. The cerebellum and brainstem exhibit     noacute abnormality. At bone window settings there are no air-fluid   levels in the paranasal sinuses.    IMPRESSION:   1. There are chronic changes in the right frontal and right occipital   lobes consistent with previous CVA.  2. There age-related atrophic changes and evidence of small vessel   ischemic type change.  3. There is no evidence of an acute intracranial hemorrhage nor of an   evolving ischemic infarction.  4. There is no intracranial mass effect.     Dictation Site: 5        Verified By: DAVID A. Martinique, M.D., MD  Radiology Impression:  Radiology Impression: CT of head personally reviewed by me  and shows R frontal encephalomalacia with previous R craniotomy as well as what looks like a small R PCA infarct and old white matter changes.   Impression/Recommendations:  Recommendations:   labs reviewed and unremarkable d/w with referring physician and PT   Probable Meniere's disease-  given hearing loss, tinnitus and vertigo and the hx that this has been going on for 2 years+;  this could be like an acoustic neuroma but we can not get imaging.  Doubt BPPV. CAD-  stable Hypertension-  mildly elevated Diabetes-  controlled  start HCTZ 12.64m daily for HTN and Menieres;  this can be increased as tolerated Meclinizine 22mTID PRN dizziness needs full audiometry and ENT consult as outpatient pt told not to drive until this gets better PT for vestibular rehab will sign off, no Neurology f/u needed  Electronic Signatures: Stephen Weeks)  (Signed 31-Jul-13 11:06)  Authored: REFERRING PHYSICIAN, Primary Care Physician, Consult, History of Present Illness, Review of Systems, PAST MEDICAL/SURGICAL HISTORY, HOME MEDICATIONS, ALLERGIES, Social/Family History, NURSING VITAL SIGNS, Physical Exam-, LAB RESULTS, RADIOLOGY RESULTS, Recommendations   Last Updated: 31-Jul-13 11:06 by Stephen Weeks)

## 2014-05-23 NOTE — Discharge Summary (Signed)
PATIENT NAME:  Stephen Weeks, Stephen Weeks MR#:  [id number] DATE OF BIRTH:  03-Sep-1935  DATE OF ADMISSION:  08/26/2011 DATE OF DISCHARGE:  09/02/2011  ADMITTING DIAGNOSIS: Acute respiratory failure, syncope, and bradycardia.   DISCHARGE DIAGNOSES:  1. Acute respiratory failure due to combination of chronic obstructive pulmonary disease exacerbation and acute bronchitis as well as congestive heart failure, acute on chronic diastolic.  2. Syncope and bradycardia likely due to beta blockers, resolved when the patient was taken off beta blockers. 3. History of coronary artery disease, elevated troponin, likely due to renal failure, chronic.  4. Diabetes mellitus.  5. Hypertension, malignant, due to clonidine withdrawal.  6. Hyperlipidemia.  7. Benign prostatic hypertrophy. 8. Thoracic aneurysm.  9. Seizure disorder. 10. Gout. 11. Chronic renal failure with baseline creatinine around 2.  12. Ongoing tobacco abuse.  13. Thrombocytopenia.  14. Leukocytosis likely due to steroids, resolving. 15. Cerebrovascular accident, acute with infarct, thrombotic likely with unsteady gait requiring rehabilitation placement. 16. Mild cardiomyopathy with ejection fraction of 45 to 50% and concentric left ventricular hypertrophy on echocardiogram.   CONSULTANTS:  1. Corky Downs, MD - Cardiology. 2. Social Work.  RADIOLOGIC STUDIES: Chest x-ray, PA and lateral, on 08/26/2011, showed no acute cardiopulmonary disease.   CT of chest without contrast on 08/26/2011 revealed a known small aneurysm of the ascending thoracic aorta at the level of the aortic arch and there is known descending thoracic aortic aneurysm with maximal AP dimension of 4.5 cm. There is no objective evidence of leakage of blood or dissection on this noncontrast exam. There is no evidence of congestive heart failure or pneumonia. No pulmonary parenchymal mass or mediastinal mass demonstrated. No evidence of pneumothorax or pleural effusion or pericardial  effusion.  One-way chest x-ray, on 08/30/2011, showed findings consistent with mild congestive heart failure with minimal interstitial edema and small left pleural effusion. Follow-up PA and lateral chest x-ray would be of value.   Lung V/Q scan on 08/27/2011 was negative for pulmonary embolism.   CT scan of the head without contrast on 08/31/2011 revealed chronic changes in the right frontal and right occipital lobes consistent with previous cerebrovascular accident, age-related atrophic changes and evidence of small vessel ischemic type change. No evidence of acute intracranial hemorrhage or evolving ischemic infarction. No intracranial mass affect.   HISTORY OF PRESENT ILLNESS: The patient is a 79 year old Caucasian male with past medical history significant for history of tobacco abuse, chronic obstructive pulmonary disease, history of seizure disorder, chronic kidney disease, and history of depression who presented to the hospital with complaints of syncope. Please refer to Dr. Mathews Robinsons admission note on 08/26/2011. Apparently he was recently in the hospital due to syncope which was felt to be likely due to bradycardia. His metoprolol was stopped. He also had a seizure at that time and was initiated on Keppra. He was home for a few days and on the day of admission he became somewhat cold, then started sweating profusely, got dizzy and was not able to breathe. He called EMS. He was presyncopal and very weak. In the emergency room, he was placed on 100% nonrebreather and hospitalist services were contacted for admission.   On arrival to the emergency room, the patient was bradycardic with pulse of 52, temperature of 98.2, respirations 16, blood pressure 157/62, and saturation was 100% on 4 liters of oxygen through nasal cannula. Physical exam revealed scattered rhonchi at the bases, no rales, coarse sounds bilaterally, as well as decreased breath sounds bilaterally. He had also 1+  lower extremity  edema.   LABORATORY, DIAGNOSTIC AND RADIOLOGIC DATA: Initially the patient's B-type natriuretic peptide was elevated at 2427. BUN and creatinine were 29 and 1.82. Otherwise BMP was unremarkable. The patient's liver enzymes revealed an albumin level of 3.1, otherwise unremarkable. First set of cardiac enzymes showed mild elevation of troponin to 0.07, however, the second set and third sets were completely within normal limits. The patient's white blood cell count was slightly elevated to 11.1, hemoglobin was 13.8, and platelet count 125. Coagulation panel was unremarkable.   ABGs were done on 100% nonrebreather. pH was 7.45, pCO2 was 36, pO2 279, and saturation 99.1%.   EKG revealed sinus brady at 54 beats per minute, left axis deviation, and left bundle block. It was an abnormal EKG.  HOSPITAL COURSE: The patient was admitted to the hospital. Initially it was felt that the patient's presentation of hypoxia could have been related to chronic obstructive pulmonary disease exacerbation, acute bronchitis, and he was initiated on antibiotic therapy. He also was felt to have presyncopal episode related to bradycardia and could have been related to clonidine use.  In regards to acute respiratory failure, as mentioned above, the patient was felt to be having COPD exacerbation/acute bronchitis. He was treated with antibiotics and steroids as well as inhalation therapy, however with no significant success, unfortunately. He was also rehydrated with IV fluids because of concern that the patient had acute on chronic renal failure. With rehydration the patient developed congestive heart failure and had acute on chronic CHF, diastolic. Cardiology consultation was obtained and the cardiologist felt that the patient warrants diuresis. The patient had an echocardiogram done on 08/28/2011 which showed septal akinesis, also regional wall motion abnormalities and moderate concentric left ventricular hypertrophy with ejection  fraction of 45% to 50% and mild to moderate mitral regurgitation. The patient was started on diuresis and with this therapy, diuretics as well as antibiotics and steroids for COPD exacerbation and bronchitis, the patient's condition improved. Initially it was felt that the patient may need oxygen therapy at home, however, with diuresis the patient's oxygenation improved. On the day of discharge, the patient's vital signs revealed a temperature of 98, pulse 60, respiratory rate 18, blood pressure 162/79, and saturation was 100% on room air at rest. The patient will be walked now in the hospital and his oxygen saturation will be rechecked on exertion on room air.   In regards to bradycardia, it was unclear if the patient's bradycardia was related to any kind of arrhythmia, however, the patient stayed on telemetry for a prolonged period of time in the hospital and it was not noted any arrhythmias or pauses. Cardiologist, Dr. Corky DownsJaved Masoud, also did not feel that the patient warrants pacemaker placement. His clonidine was suspended initially, however, it was restarted later on because of rebound hypertension the patient had. The patient was followed very closely in regards to bradycardia and he actually did not develop any while in the hospital. It is recommended to follow the patient's heart rate periodically especially now since he is back on clonidine.   In regards to malignant hypertension, as mentioned above, the patient's blood pressure medications where placed on hold, clonidine was placed on hold, due to concerns of syncope related to clonidine, however, later he was orthostatic because of rebound hypotension. The patient's blood pressure was attempted to be controlled, however, he had bouts of increased blood pressure as well as low blood pressure requiring some adjustment. Norvasc was started and because the patient has  had acute on chronic congestive heart failure, it was felt that the patient would  benefit also from after load reduction agents such as ACE inhibitor,  hydralazine/Imdur combination. However, the patient was not a candidate for ACE inhibitor or ARB due to his renal failure so hydralazine as well as Imdur was started at a low dose. It is recommended to follow the patient's blood pressure readings and make decisions about advancement of those medications as necessary.   For hyperlipidemia, the patient is to continue his outpatient medications. The patient's lipid panel was checked and LDL was found to be 88. The patient's total cholesterol was 151, triglycerides 106, and HDL  42. As the patient was felt to have a stroke, his Lipitor was advanced to preferably control his LDL even better below 70. It is recommended to follow the patient's lipid panel as outpatient and make decisions about his Lipitor and lipid management.   For benign prostatic hypertrophy, the patient is to continue outpatient medications. No complaints.   In regards to thoracic aneurysm, as mentioned above, the patient's CT scan did not show any changes. The patient was advised to very closely monitor his heart rate, especially his blood pressure, and make decisions about thoracic aneurysm followup as an outpatient.   For seizure disorder, the patient is to continue Keppra. The patient did not have any seizures during this admission.   For gout, the patient is to continue allopurinol, however, the patient's allopurinol was changed to renal dose.   For chronic renal failure, initially the patient's creatinine was elevated to 1.8 and it was felt that the patient could have had acute on chronic renal failure, however, with rehydration, the patient's kidney function did not improve, but he developed pulmonary edema, so he was given Lasix and the patient's creatinine now is around 2.06, on the day of discharge, 09/02/2011, and it could be the patient's baseline. The patient is to continue follow-up with his primary care  physician as well as possibly nephrologist as an outpatient. He however had good response to Lasix. He is to continue Lasix for his congestive heart failure management. Lasix should be advanced as necessary if he is not responding to low dose of Lasix.   In regards to tobacco abuse, the patient was counseled about tobacco use and was recommended replacement therapy. However, he did not want to take one.   The patient was noted to have thrombocytopenia while in the hospital. The patient's platelet count remained stable at around 120 to 130. It is recommended to follow the patient's platelet count as an outpatient especially since he is on aspirin as well as Plavix.   The patient was noted to have an unsteady gait with no significant weakness in his upper or lower extremities. Unsteady gait was felt to be acute cerebrovascular accident, very likely traumatic infarct. The patient is to continue medications with aspirin as well as Plavix for his history of chronic disease for prevention of a stroke. His Lipitor, however, was advanced to 40 mg p.o. daily dose to control his LDL preferably to below 70. The patient had an echocardiogram done, which as mentioned above revealed some regional wall motion abnormalities and septal akinesis and moderate concentric left ventricular hypertrophy with ejection fraction of 45% to 50%, however, we did not do a carotid ultrasound during this admission as one was done on 08/18/2011. At that time, the patient was noted to have no hemodynamically significant carotid artery stenosis. The patient is being discharged now to  rehabilitation when a physical therapist recommended it for due to unsteady gait.   TIME SPENT: 40 minutes.  ____________________________ Katharina Caper, MD rv:slb D: 09/02/2011 10:51:06 ET T: 09/02/2011 11:42:09 ET JOB#: [id number]  cc: Katharina Caper, MD, <Dictator> French Kendra MD ELECTRONICALLY SIGNED 09/08/2011 1:10
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
## 2014-05-23 NOTE — H&P (Signed)
PATIENT NAME:  Stephen Weeks, Stephen Weeks MR#:  [id number] DATE OF BIRTH:  1935/05/09  DATE OF ADMISSION:  08/18/2011  ER REFERRING PHYSICIAN: Maricela Bo, MD  PRIMARY CARE PHYSICIAN: Stephen Downs, MD  CHIEF COMPLAINT: Syncopal episodes, generalized weakness.   HISTORY OF PRESENT ILLNESS: The patient is a 79 year old male with past medical history of diabetes, depression with suicidal ideation, gout, benign prostatic hypertrophy, hyperlipidemia, and history of thoracic aneurysm who reports that he has not been feeling well for some time. He was recently treated for pneumonia and finished Levaquin four days ago. He presented with syncopal episodes. He reports that he has passed out more than a year ago for no reason which he did not report to his family or a physician. At that time, he was abusing alcohol. He has had a chronic cough for some time. Today he had some shortness of breath. After his antibiotic he also developed some diarrhea, which has eased off. Today when he woke up he felt cold and decided to sit outside. He had already taken his morning medications. He sat outside in the sun for about 10 minutes and felt weak and could not stand up; therefore, he called his daughter and granddaughter who had to help him back into the house, but he was barely able to walk. While sitting on the couch, the patient passed out five times, as per the daughter at bedside. They took an Accu-Chek and it was 141.   ALLERGIES: Vicodin and sulfa medications.   PAST MEDICAL HISTORY:  1. History of alcohol abuse. 2. Ongoing smoking. 3. Depression with suicidal ideation. 4. Diabetes. 5. Gout. 6. Hyperlipidemia. 7. Benign prostatic hypertrophy. 8. Hypertension. 9. History of atrial fibrillation.  10. History of transient ischemic attack. 11. History of peripheral vascular disease with history of thoracic aneurysm. 12. Last admitted to [facility] in December 2012 for headache and chest pain due to uncontrolled hypertension.  The patient had an Echo at that time which showed an ejection fraction of 50% with moderate to severe mitral regurgitation. Subsequently he had a stress test in January 2013 which showed ejection fraction of 30% and predominantly fixed defects with multiple areas of hypokinesis, but no evidence of reversible ischemia.   PAST SURGICAL HISTORY:  1. Cholecystectomy.  2. Appendectomy.  3. Cardiac stent.  CURRENT MEDICATIONS:  1. Allopurinol 300 mg daily.  2. Aspirin 81 mg daily.  3. Lipitor 20 mg daily.  4. Clonidine 0.1 mg 1/2 tablet twice a day. 5. Plavix 75 mg daily.  6. Aricept 10 mg twice a day. 7. Lexapro 5 mg daily.  8. Flonase two sprays to each nostril once a day. 9. Glipizide 2.5 mg daily.  10. Lopressor 50 mg twice a day. 11. Multivitamin 1 tablet once a day. 12. Zoloft 150 mg at bedtime. 13. Flomax 0.4 mg daily.  14. Vitamin B12 1000 mcg injected once a month.   SOCIAL HISTORY: The patient reports he quit drinking alcohol about a year ago. He drank about two beers two to three weeks ago. He smokes one pack per day. Denies any drug abuse. He is living with his daughter and her family.   FAMILY HISTORY: Mother had a myocardial infarction. Both parents had coronary artery disease. Mother had coronary artery bypass graft. Family history is also positive for diabetes.   REVIEW OF SYSTEMS: The patient reports fatigue and weakness. Denies any fever. EYES: Denies any blurred or double vision. ENT: Denies any tinnitus or ear pain. RESPIRATORY: Reports chronic cough and  shortness of breath. CARDIOVASCULAR: Denies any chest pain or palpitations. GI: Denies any nausea, vomiting, diarrhea, or abdominal pain. GU: Denies any dysuria or hematuria. ENDOCRINE: Denies any polyuria or nocturia. HEME/LYMPH: Denies any anemia or easy bruisability. INTEGUMENT: Denies any acne or rash. MUSCULOSKELETAL: Denies any swelling or gout. NEUROLOGICAL: Denies any numbness or weakness. PSYCH: Has history of  depression.   PHYSICAL EXAMINATION:   VITAL SIGNS: Temperature 99.2, heart rate 47, respiratory rate 21, blood pressure 132/64, and pulse oximetry is 98% on room air.   GENERAL: The patient is a chronically ill appearing obese male lying in bed, not in acute distress. He is having intermittent episodes of coughing.  HEAD: Atraumatic, normocephalic.   EYES: There is some pallor. No icterus or cyanosis. Pupils are equal, round, and reactive to light and accommodation. Extraocular movements intact.   ENT: Wet mucous membranes. No oropharyngeal erythema or thrush.   NECK: Shortened and thick neck.  No JVD or lymphadenopathy. No masses.   LUNGS: Bilateral basal crepitations. No wheezing or rhonchi.  HEART: S1 and S2 regular. There is a systolic murmur. No rubs or gallops.   ABDOMEN: Soft, nontender, and nondistended. No clubbing or rigidity. No organomegaly. Normal bowel sounds.   SKIN: No rashes or lesions.   PERIPHERIES: Pedal edema. Poorly palpable pedal pulses.   MUSCULOSKELETAL: No cyanosis or clubbing.   NEUROLOGIC: Awake, alert, and oriented x3. Nonfocal neurological exam. Cranial nerves grossly intact.   PSYCH: Normal mood and affect.   RESULTS: CT of the chest shows slightly increased prominence of the patient's descending thoracic aortic aneurysm without evidence of rupture. 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  Urinalysis shows no evidence of infection.   Cardiac enzymes negative. CBC normal other than platelet count of 140. Glucose 131, BUN 20, creatinine 1.64, sodium 142, potassium 4.1, chloride 107, and CO2 27. The rest of complete metabolic panel normal.  ASSESSMENT AND PLAN: A 79 year old male with past medical history of diabetes, gout, and hyperlipidemia who presents with syncopal episode.  1. Syncope, etiology is unclear but bradycardia could be contributing. The patient took his medication and one hour later when he went out to sit in the sun  because he was not feeling well subsequently he had five episodes of syncope. His heart rate is dropping down as low as in the 40s. He is on clonidine and Lopressor 50 mg twice a day. We will hold his Lopressor, continue low-dose clonidine, monitor on telemetry, and check orthostatics, MRI of the brain, and carotid ultrasound. We will not do an echo since it was last done in December of 2012. He also had subsequent stress test which showed no evidence of ischemia.  2. Bradycardia, possibly symptomatic resulting in syncope. We will hold his Lopressor and monitor on telemetry. Check serial cardiac enzymes.  3. Possible chronic obstructive pulmonary disease. The patient reports a chronic cough and intermittent shortness of breath. He is a long-term smoker. We will give him a trial of Symbicort, Spiriva, and p.r.n. DuoNebs. We will also start him on antitussive therapy.  4. Hyperglycemia, possibly reactive.  5. Chronic kidney disease. The patient's renal ultrasound in December of 2012 was normal. The patient's renal insufficiency appears to be at baseline.  6. Diabetes. We will place an ADA diet and insulin sliding scale and continue Glipizide.  7. History of gout. We will continue allopurinol. 8. History of hyperlipidemia. We will continue statin therapy.  9. History of depression and suicidal ideation in the past. We will  continue his current psychiatric medications.  10. History of benign prostatic hypertrophy. We will continue Flomax.      I discussed code status with the patient and his daughter. He wishes to be a DO NOT RESUSCITATE.   TIME SPENT: 75 minutes.   ____________________________ Stephen MeigsSangeeta Kaleth Koy, MD sp:slb D: 08/18/2011 20:55:06 ET T: 08/19/2011 08:49:09 ET JOB#: [id number]  cc: Stephen MeigsSangeeta Lillie Bollig, MD, <Dictator> Stephen DownsJaved Masoud, MD Stephen MeigsSANGEETA Kashish Yglesias MD ELECTRONICALLY SIGNED 08/19/2011 13:07

## 2014-05-23 NOTE — Discharge Summary (Signed)
PATIENT NAME:  Stephen Weeks, Stephen Weeks MR#:  [id number] DATE OF BIRTH:  [birth year]  DATE OF ADMISSION:  08/26/2011 DATE OF DISCHARGE:  09/03/2011  ADDENDUM: The patient was not discharged to a skilled nursing facility because he was having increasing episodes of vertigo as well as unsteady gait. Those episodes were intermittent and not consistent with CVA. For this reason, neurology consultation was obtained and Dr. Mellody DrownMatthew Smith saw the patient in consultation on 09/03/2011. Dr. Katrinka BlazingSmith examined the patient and felt the patient had probable Meniere's disease given hearing loss and tinnitus as well as vertigo and history that it has been ongoing for approximately two or more years. Also Dr. Katrinka BlazingSmith was concerned that the patient may have also acoustic neuroma, however, we cannot get imaging. He doubted BPPV. He recommended to start HCTZ at 12.5 mg Weeks.o. daily for hypertension as well as Meniere's disease which can be increased as tolerated as well as meclizine 25 mg three times daily as needed for dizziness. He also recommended to get full audiometry as well as ENT consult as an outpatient and the patient was told not to drive until he gets better. He also recommended vestibular rehabilitation, physical therapy, and neurology follow-up as needed. The patient's medications are being updated and the patient will be discharged with the following medications: 1. Clonidine 0.1 mg Weeks.o. twice daily. 2. Keppra 1,000 mg Weeks.o. twice daily.  3. Tamsulosin 0.4 mg Weeks.o. daily. 4. Clopidogrel 75 mg Weeks.o. daily. 5. Fluticasone 50 mcg inhalation nasal spray, two sprays in nostril once daily. 6. Symbicort 160/4.5 mg two puffs twice daily.  7. Spiriva 18 mcg inhalation capsule once daily.  8. Lexapro 10 mg Weeks.o. daily.  9. Remeron 50 mg Weeks.o. at bedtime.  10. Ambien 5 mg Weeks.o. at bedtime as needed.  11. Glipizide 2.5 mg Weeks.o. daily.   ADDITIONAL MEDICATIONS:  1. Allopurinol 1 mg Weeks.o. daily - this is a new dose.  2. Aspirin 81 mg Weeks.o.  daily.  3. Sliding scale insulin.  4. Nicotine patch 14 mg topically daily.  5. Norvasc 5 mg Weeks.o. twice daily.  6. Robitussin-DM 10 mL Weeks.o. every four hours as needed.  7. Lasix 20 mg Weeks.o. daily.  8. Hydralazine 10 mg Weeks.o. three times daily. 9. Prednisone dose was completed.  10. Imdur 30 mg Weeks.o. daily.  11. Lipitor 40 mg Weeks.o. at bedtime.  12. Humibid LA 600 mg Weeks.o. twice daily.  13. HCTZ 12.5 mg Weeks.o. daily.  14. Meclizine 25 mg Weeks.o. three times daily as needed.   The patient is to see ENT in the next two days after discharge as well as Dr. Corky DownsJaved Masoud in two days after discharge.  On the day of discharge, the patient's temperature is 97.8, pulse 83, respirations 20, blood pressure is ranging from 150 to 160s systolic and 50s to 80s diastolic, and oxygen saturation is 92% to 95% on 2 liters of oxygen through nasal cannula at rest.   TIME SPENT: 40 minutes.  ____________________________ Katharina Caperima Lawrencia Mauney, MD rv:slb D: 09/03/2011 12:54:43 ET T: 09/03/2011 13:08:28 ET JOB#: [id number]  cc: Katharina Caperima Yuliet Needs, MD, <Dictator> Corky DownsJaved Masoud, MD ENT physician on call where patient is going to be referred. Holger Sokolowski MD ELECTRONICALLY SIGNED 09/08/2011 1:10

## 2014-05-28 NOTE — Discharge Summary (Signed)
PATIENT NAME:  Stephen Weeks, [name] MR#:  [id number] DATE OF BIRTH:  1935/05/10  DATE OF ADMISSION:  02/01/2011 DATE OF DISCHARGE:  02/03/2011  ADDENDUM: The patient was supposed to go yesterday, but discharge was canceled because blood cultures showed gram-positive rods. I called the lab, said it was a contaminant. The patient is doing better, so we are going to discharge him. The patient needs to follow up with Dr. Gwen PoundsKowalski as the echocardiogram showed wall motion abnormalities in the anterior wall, anterolateral wall, and apical region, along with inferior wall and septal. The patient's ejection fraction is 50%. This was discussed with Dr. Gwen PoundsKowalski. He suggested that the patient needs to follow up with him, and probably he will get a stress test; and he said the patient can be discharged. The patient also had a renal ultrasound for his renal insufficiency which showed normal with no acute changes with mild prostate hypertrophy. The patient does have some cough. We are going to discharge him with a Z-Pak. The patient's blood pressure is also elevated to 178/90. I will increase his metoprolol to 50 mg p.o. b.i.d. The rest of the discharge medications and diagnoses remain the same.   TIME SPENT ON PREPARATION: Less than 30 minutes.   ____________________________ Katha HammingSnehalatha Romaldo Saville, MD sk:cbb D: 02/03/2011 12:05:42 ET T: 02/06/2011 11:08:26 ET JOB#: [id number]  cc: Katha HammingSnehalatha Savian Mazon, MD, <Dictator>

## 2014-05-28 NOTE — Discharge Summary (Signed)
PATIENT NAME:  Stephen Weeks, [name] MR#:  [id number] DATE OF BIRTH:  1935/04/21  DATE OF ADMISSION:  02/01/2011 DATE OF DISCHARGE:  02/03/2011  DISCHARGE DIAGNOSES:  1. Chest pain due to uncontrolled hypertension.  2. Headache because of uncontrolled hypertension.  3. The patient also has a history of diabetes, gout, hyperlipidemia.   CONSULTATIONS: Cardiology consult, Dr. Gwen PoundsKowalski.   ALLERGIES: Sulfa.   DISCHARGE MEDICATIONS:  1. Allopurinol 300 mg p.o. daily.  2. Amitriptyline 25 mg at night.   3. Clonidine 0.1 mg p.o. b.i.d.  4. Glipizide 2.5 mg p.o. daily.  5. Plavix 75 mg p.o. daily.  6. Simvastatin 20 mg at night.   7. Metoprolol 25 mg p.o. b.i.d.  The patient also has a history of transient ischemic attacks before.   HOSPITAL COURSE: Look in the History and Physical for full details.  1. The patient is a  [age] year old male with history of hypertension, diabetes, hyperlipidemia who came in because of chest pain and headache. 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 The patient's blood pressure was 179/90. He was told if blood pressure was up to come to the ER, so he came in; and he is admitted to CCU for uncontrolled hypertension and ordered esmolol drip but it was not started as blood pressure came down nicely with the medication and chest pain thought to be secondary to elevated blood pressure. His troponins have been negative. His echocardiogram showed ejection fraction of more than 50%, and EKG also showed no ST-T changes. Seen by Dr. Gwen PoundsKowalski and is recommended that he can be discharged and follow up with him as an outpatient. The patient denies any chest pain and he is moved out of ICU to regular floor yesterday. The patient will be  discharged with aspirin, beta-blockers and also continue his home medications and he needs to follow up with a cardiologist for stress test as an outpatient.  2. Diabetes mellitus, type  II. Hemoglobin A1c is 5.7.  Continue his home dose glipizide.   3. Hyperlipidemia.  Lipids are LDL 64 and he can continue his home dose simvastatin. The patient had history of strokes and also some memory lapse.  The patient's daughter said it is not new, and his CT head did not show any acute changes. Patient's WBC is slightly elevated today at 13.6, but he has no fever, no evidence of infection and this was thought to be related to stress.   4. He also has acute renal failure. When he came in BUN was 24, creatinine 1.83 on January 31, 2011, and this morning it is 29 and 1.69. Patient's GFR is 42. The patient has chronic kidney disease, probably because of diabetes. [place] so we do not have previous labs, and we hydrated him with normal saline. The patient can follow up with primary doctor in 1 to 2 weeks. He is going to follow with Dr. Gwen PoundsKowalski. Condition is stable. We will have patient walk with the cane before he goes. He says he uses a cane at home, and he also says he feels much better. Blood pressure today is 158/82, pulse 81, respirations 20, temperature 98.4, saturations are 89% on room air.   TIME SPENT ON DISCHARGE PREPARATION: More than 30 minutes.     ____________________________ Katha HammingSnehalatha Dondre Catalfamo, MD sk:vtd D: 02/02/2011 12:17:07 ET T: 02/05/2011 14:25:20 ET JOB#: [id number]  cc: Katha HammingSnehalatha Charonda Hefter, MD, <Dictator> Katha HammingSNEHALATHA Earl Zellmer  MD ELECTRONICALLY SIGNED 02/16/2011 16:49

## 2014-05-28 NOTE — Discharge Summary (Signed)
PATIENT NAME:  Stephen Weeks, Stephen Weeks MR#:  [id number] DATE OF BIRTH:  September 29, 1935  DATE OF ADMISSION:  02/01/2011 DATE OF DISCHARGE:  02/03/2011  ADDENDUM: The patient was supposed to go yesterday, but discharge was canceled because blood cultures showed gram-positive rods. I called the lab, said it was a contaminant. The patient is doing better, so we are going to discharge him. The patient needs to follow up with Dr. Gwen PoundsKowalski as the echocardiogram showed wall motion abnormalities in the anterior wall, anterolateral wall, and apical region, along with inferior wall and septal. The patient's ejection fraction is 50%. This was discussed with Dr. Gwen PoundsKowalski. He suggested that the patient needs to follow up with him, and probably he will get a stress test; and he said the patient can be discharged. The patient also had a renal ultrasound for his renal insufficiency which showed normal with no acute changes with mild prostate hypertrophy. The patient does have some cough. We are going to discharge him with a Z-Pak. The patient's blood pressure is also elevated to 178/90. I will increase his metoprolol to 50 mg Weeks.o. b.i.d. The rest of the discharge medications and diagnoses remain the same.   TIME SPENT ON PREPARATION: Less than 30 minutes.   ____________________________ Katha HammingSnehalatha Karsyn Rochin, MD sk:cbb D: 02/03/2011 12:05:00 ET T: 02/06/2011 11:08:26 ET JOB#: [id number]  cc: Katha HammingSnehalatha Tonya Wantz, MD, <Dictator>  Katha HammingSNEHALATHA Loran Fleet MD ELECTRONICALLY SIGNED 02/09/2011 22:09

## 2018-06-04 DEATH — deceased
